# Patient Record
Sex: Male | Born: 1975
Health system: Southern US, Community
[De-identification: ages and names within clinical notes are randomized; demographics above are authoritative.]

## PROBLEM LIST (undated history)

## (undated) DIAGNOSIS — K274 Chronic or unspecified peptic ulcer, site unspecified, with hemorrhage: Secondary | ICD-10-CM

## (undated) DIAGNOSIS — K284 Chronic or unspecified gastrojejunal ulcer with hemorrhage: Secondary | ICD-10-CM

## (undated) DIAGNOSIS — G4733 Obstructive sleep apnea (adult) (pediatric): Secondary | ICD-10-CM

## (undated) DIAGNOSIS — E785 Hyperlipidemia, unspecified: Secondary | ICD-10-CM

## (undated) DIAGNOSIS — F419 Anxiety disorder, unspecified: Secondary | ICD-10-CM

## (undated) DIAGNOSIS — I10 Essential (primary) hypertension: Secondary | ICD-10-CM

## (undated) HISTORY — DX: Anxiety disorder, unspecified: F41.9

## (undated) HISTORY — DX: Obstructive sleep apnea (adult) (pediatric): G47.33

## (undated) HISTORY — DX: Hyperlipidemia, unspecified: E78.5

## (undated) HISTORY — PX: TONSILLECTOMY: SUR1361

---

## 2015-08-21 ENCOUNTER — Encounter (HOSPITAL_BASED_OUTPATIENT_CLINIC_OR_DEPARTMENT_OTHER): Payer: Self-pay | Admitting: *Deleted

## 2015-08-21 DIAGNOSIS — Z760 Encounter for issue of repeat prescription: Secondary | ICD-10-CM | POA: Insufficient documentation

## 2015-08-21 DIAGNOSIS — F17210 Nicotine dependence, cigarettes, uncomplicated: Secondary | ICD-10-CM | POA: Diagnosis not present

## 2015-08-21 DIAGNOSIS — Z8719 Personal history of other diseases of the digestive system: Secondary | ICD-10-CM | POA: Insufficient documentation

## 2015-08-21 DIAGNOSIS — I10 Essential (primary) hypertension: Secondary | ICD-10-CM | POA: Insufficient documentation

## 2015-08-21 NOTE — ED Notes (Signed)
Hypertension. States he has not been taking his BP medication for the past 6 months. Slight dizziness earlier today.

## 2015-08-22 ENCOUNTER — Emergency Department (HOSPITAL_BASED_OUTPATIENT_CLINIC_OR_DEPARTMENT_OTHER)
Admission: EM | Admit: 2015-08-22 | Discharge: 2015-08-22 | Disposition: A | Discharge status: Home / Self Care | Payer: BLUE CROSS/BLUE SHIELD | Attending: Emergency Medicine | Admitting: Emergency Medicine

## 2015-08-22 DIAGNOSIS — Z760 Encounter for issue of repeat prescription: Secondary | ICD-10-CM

## 2015-08-22 DIAGNOSIS — I10 Essential (primary) hypertension: Secondary | ICD-10-CM

## 2015-08-22 HISTORY — DX: Chronic or unspecified gastrojejunal ulcer with hemorrhage: K28.4

## 2015-08-22 HISTORY — DX: Chronic or unspecified peptic ulcer, site unspecified, with hemorrhage: K27.4

## 2015-08-22 HISTORY — DX: Essential (primary) hypertension: I10

## 2015-08-22 MED ORDER — AMLODIPINE BESYLATE 10 MG PO TABS: 10.0000 mg | ORAL_TABLET | Freq: Every day | ORAL | Status: DC

## 2015-08-22 MED ORDER — ATORVASTATIN CALCIUM 20 MG PO TABS: 20.0000 mg | ORAL_TABLET | Freq: Every day | ORAL | Status: DC

## 2015-08-22 MED ORDER — METOPROLOL SUCCINATE ER 50 MG PO TB24
50.0000 mg | ORAL_TABLET | Freq: Every day | ORAL | Status: DC
Start: 2015-08-22 — End: 2015-09-17

## 2015-08-22 NOTE — ED Provider Notes (Signed)
CSN: [id number]     Arrival date & time 08/21/15  2252 History   First MD Initiated Contact with Patient 08/22/15 [phone]     Chief Complaint  Patient presents with  . Hypertension     (Consider location/radiation/quality/duration/timing/severity/associated sxs/prior Treatment) HPI  This is a 39 year old male with a history of hypertension. He has not taken his antihypertensives in about 6 months because his physician went out of network on his insurance plan. He is here with elevated blood pressure which he noted yesterday after feeling some mild lightheadedness. It was the first time he had checked his blood pressure in 6 months. He denies chest pain, shortness of breath, headache, visual changes, nausea, vomiting or diarrhea. His blood pressure was noted to be 244/129 on arrival, rechecked it was 195/104. He is requesting refills of his antihypertensives and Lipitor. [facility] Toprol-XL 50 milligrams daily, Lipitor 20 milligrams daily and Norvasc 10 milligrams daily.  Past Medical History  Diagnosis Date  . Hypertension   . Bleeding ulcer    History reviewed. No pertinent past surgical history. No family history on file. Social History  Substance Use Topics  . Smoking status: Current Every Day Smoker -- 1.50 packs/day    Types: Cigarettes  . Smokeless tobacco: None  . Alcohol Use: No    Review of Systems  All other systems reviewed and are negative.   Allergies  Review of patient's allergies indicates no known allergies.  Home Medications   Prior to Admission medications   Not on File   BP 195/104 mmHg  Pulse 118  Temp(Src) 99.2 F (37.3 C) (Oral)  Resp 18  Ht 5\' 11"  (1.803 m)  Wt 235 lb (106.595 kg)  BMI 32.79 kg/m2  SpO2 100%   Physical Exam  General: Well-developed, well-nourished male in no acute distress; appearance consistent with age of record HENT: normocephalic; atraumatic Eyes: pupils equal, round and reactive to light; extraocular  muscles intact Neck: supple Heart: regular rate and rhythm Lungs: clear to auscultation bilaterally Abdomen: soft; nondistended; nontender; bowel sounds present Extremities: No deformity; full range of motion; pulses normal Neurologic: Awake, alert and oriented; motor function intact in all extremities and symmetric; no facial droop Skin: Warm and dry Psychiatric: Normal mood and affect    ED Course  Procedures (including critical care time)   EKG Interpretation   Date/Time:  Thursday August 21 2015 23:06:19 EST Ventricular Rate:  90 PR Interval:  136 QRS Duration: 82 QT Interval:  338 QTC Calculation: 413 R Axis:   64 Text Interpretation:  Normal sinus rhythm Left ventricular hypertrophy  Abnormal ECG No previous ECGs available Confirmed by Elira Colasanti  MD, Jonny RuizJOHN  [phone]([id number]) on 08/21/2015 11:09:15 PM      MDM  We will refill his medications and provide a list of physician referrals so he can reestablish with a PCP.    Paula LibraJohn Fedra Lanter, MD 08/22/15 [phone]

## 2015-08-22 NOTE — Discharge Instructions (Signed)
Hypertension Hypertension, commonly called high blood pressure, is when the force of blood pumping through your arteries is too strong. Your arteries are the blood vessels that carry blood from your heart throughout your body. A blood pressure reading consists of a higher number over a lower number, such as 110/72. The higher number (systolic) is the pressure inside your arteries when your heart pumps. The lower number (diastolic) is the pressure inside your arteries when your heart relaxes. Ideally you want your blood pressure below 120/80. Hypertension forces your heart to work harder to pump blood. Your arteries may become narrow or stiff. Having untreated or uncontrolled hypertension can cause heart attack, stroke, kidney disease, and other problems. RISK FACTORS Some risk factors for high blood pressure are controllable. Others are not.  Risk factors you cannot control include:   Race. You may be at higher risk if you are African American.  Age. Risk increases with age.  Gender. Men are at higher risk than women before age 45 years. After age 65, women are at higher risk than men. Risk factors you can control include:  Not getting enough exercise or physical activity.  Being overweight.  Getting too much fat, sugar, calories, or salt in your diet.  Drinking too much alcohol. SIGNS AND SYMPTOMS Hypertension does not usually cause signs or symptoms. Extremely high blood pressure (hypertensive crisis) may cause headache, anxiety, shortness of breath, and nosebleed. DIAGNOSIS To check if you have hypertension, your health care provider will measure your blood pressure while you are seated, with your arm held at the level of your heart. It should be measured at least twice using the same arm. Certain conditions can cause a difference in blood pressure between your right and left arms. A blood pressure reading that is higher than normal on one occasion does not mean that you need treatment. If  it is not clear whether you have high blood pressure, you may be asked to return on a different day to have your blood pressure checked again. Or, you may be asked to monitor your blood pressure at home for 1 or more weeks. TREATMENT Treating high blood pressure includes making lifestyle changes and possibly taking medicine. Living a healthy lifestyle can help lower high blood pressure. You may need to change some of your habits. Lifestyle changes may include:  Following the DASH diet. This diet is high in fruits, vegetables, and whole grains. It is low in salt, red meat, and added sugars.  Keep your sodium intake below 2,300 mg per day.  Getting at least 30-45 minutes of aerobic exercise at least 4 times per week.  Losing weight if necessary.  Not smoking.  Limiting alcoholic beverages.  Learning ways to reduce stress. Your health care provider may prescribe medicine if lifestyle changes are not enough to get your blood pressure under control, and if one of the following is true:  You are 18-59 years of age and your systolic blood pressure is above 140.  You are 60 years of age or older, and your systolic blood pressure is above 150.  Your diastolic blood pressure is above 90.  You have diabetes, and your systolic blood pressure is over 140 or your diastolic blood pressure is over 90.  You have kidney disease and your blood pressure is above 140/90.  You have heart disease and your blood pressure is above 140/90. Your personal target blood pressure may vary depending on your medical conditions, your age, and other factors. HOME CARE INSTRUCTIONS    Have your blood pressure rechecked as directed by your health care provider.   Take medicines only as directed by your health care provider. Follow the directions carefully. Blood pressure medicines must be taken as prescribed. The medicine does not work as well when you skip doses. Skipping doses also puts you at risk for  problems.  Do not smoke.   Monitor your blood pressure at home as directed by your health care provider. SEEK MEDICAL CARE IF:   You think you are having a reaction to medicines taken.  You have recurrent headaches or feel dizzy.  You have swelling in your ankles.  You have trouble with your vision. SEEK IMMEDIATE MEDICAL CARE IF:  You develop a severe headache or confusion.  You have unusual weakness, numbness, or feel faint.  You have severe chest or abdominal pain.  You vomit repeatedly.  You have trouble breathing. MAKE SURE YOU:   Understand these instructions.  Will watch your condition.  Will get help right away if you are not doing well or get worse.   This information is not intended to replace advice given to you by your health care provider. Make sure you discuss any questions you have with your health care provider.   Document Released: 08/09/2005 Document Revised: 12/24/2014 Document Reviewed: 06/01/2013 Elsevier Interactive Patient Education 2016 [employer]  

## 2015-09-16 ENCOUNTER — Encounter: Payer: Self-pay | Admitting: *Deleted

## 2015-09-16 ENCOUNTER — Telehealth: Payer: Self-pay | Admitting: *Deleted

## 2015-09-16 NOTE — Telephone Encounter (Signed)
Pre-Visit Call completed with patient and chart updated.   Pre-Visit Info documented in Specialty Comments under [employer].    

## 2015-09-17 ENCOUNTER — Telehealth: Payer: Self-pay | Admitting: Medical

## 2015-09-17 ENCOUNTER — Encounter: Payer: Self-pay | Admitting: Medical

## 2015-09-17 ENCOUNTER — Ambulatory Visit (INDEPENDENT_AMBULATORY_CARE_PROVIDER_SITE_OTHER): Payer: BLUE CROSS/BLUE SHIELD | Admitting: Medical

## 2015-09-17 VITALS — BP 136/88 | HR 77 | Temp 98.1°F | Ht 71.5 in | Wt 263.4 lb

## 2015-09-17 DIAGNOSIS — Z0189 Encounter for other specified special examinations: Secondary | ICD-10-CM | POA: Diagnosis not present

## 2015-09-17 DIAGNOSIS — Z23 Encounter for immunization: Secondary | ICD-10-CM | POA: Diagnosis not present

## 2015-09-17 DIAGNOSIS — Z720 Tobacco use: Secondary | ICD-10-CM

## 2015-09-17 DIAGNOSIS — F17200 Nicotine dependence, unspecified, uncomplicated: Secondary | ICD-10-CM | POA: Insufficient documentation

## 2015-09-17 DIAGNOSIS — E785 Hyperlipidemia, unspecified: Secondary | ICD-10-CM | POA: Diagnosis not present

## 2015-09-17 DIAGNOSIS — I10 Essential (primary) hypertension: Secondary | ICD-10-CM | POA: Diagnosis not present

## 2015-09-17 DIAGNOSIS — Z0000 Encounter for general adult medical examination without abnormal findings: Secondary | ICD-10-CM

## 2015-09-17 LAB — COMPREHENSIVE METABOLIC PANEL
ALBUMIN: 4.6 g/dL (ref 3.5–5.2)
ALK PHOS: 76 U/L (ref 39–117)
ALT: 29 U/L (ref 0–53)
AST: 19 U/L (ref 0–37)
BILIRUBIN TOTAL: 0.4 mg/dL (ref 0.2–1.2)
BUN: 11 mg/dL (ref 6–23)
CALCIUM: 9.5 mg/dL (ref 8.4–10.5)
CO2: 31 mEq/L (ref 19–32)
CREATININE: 1.03 mg/dL (ref 0.40–1.50)
Chloride: 104 mEq/L (ref 96–112)
GFR: 101.93 mL/min (ref >60.00)
Glucose, Bld: 106 mg/dL — ABNORMAL HIGH (ref 70–99)
Potassium: 3.7 mEq/L (ref 3.5–5.1)
Sodium: 141 mEq/L (ref 135–145)
TOTAL PROTEIN: 7.6 g/dL (ref 6.0–8.3)

## 2015-09-17 LAB — CBC WITH DIFFERENTIAL/PLATELET
BASOS ABS: 0.1 10*3/uL (ref 0.0–0.1)
BASOS PCT: 0.5 % (ref 0.0–3.0)
EOS ABS: 0.1 10*3/uL (ref 0.0–0.7)
Eosinophils Relative: 0.9 % (ref 0.0–5.0)
HEMATOCRIT: 47.9 % (ref 39.0–52.0)
HEMOGLOBIN: 15.6 g/dL (ref 13.0–17.0)
LYMPHS PCT: 26.5 % (ref 12.0–46.0)
Lymphs Abs: 2.9 10*3/uL (ref 0.7–4.0)
MCHC: 32.6 g/dL (ref 30.0–36.0)
MCV: 83.1 fl (ref 78.0–100.0)
Monocytes Absolute: 0.7 10*3/uL (ref 0.1–1.0)
Monocytes Relative: 6 % (ref 3.0–12.0)
Neutro Abs: 7.2 10*3/uL (ref 1.4–7.7)
Neutrophils Relative %: 66.1 % (ref 43.0–77.0)
Platelets: 312 10*3/uL (ref 150.0–400.0)
RBC: 5.77 Mil/uL (ref 4.22–5.81)
RDW: 14.2 % (ref 11.5–15.5)
WBC: 10.9 10*3/uL — AB (ref 4.0–10.5)

## 2015-09-17 LAB — POC URINALSYSI DIPSTICK (AUTOMATED)
Bilirubin, UA: NEGATIVE
Blood, UA: NEGATIVE
Glucose, UA: NEGATIVE
Ketones, UA: NEGATIVE
LEUKOCYTES UA: NEGATIVE
Nitrite, UA: NEGATIVE
PH UA: 6.5
PROTEIN UA: NEGATIVE
SPEC GRAV UA: 1.01
UROBILINOGEN UA: 0.2

## 2015-09-17 LAB — LIPID PANEL
CHOLESTEROL: 163 mg/dL (ref 0–200)
HDL: 40.7 mg/dL (ref >39.00)
LDL Cholesterol: 88 mg/dL (ref 0–99)
NONHDL: 122.22
Total CHOL/HDL Ratio: 4
Triglycerides: 173 mg/dL — ABNORMAL HIGH (ref 0.0–149.0)
VLDL: 34.6 mg/dL (ref 0.0–40.0)

## 2015-09-17 LAB — TSH: TSH: 2.46 u[IU]/mL (ref 0.35–4.50)

## 2015-09-17 MED ORDER — AMLODIPINE BESYLATE 10 MG PO TABS: 10.0000 mg | ORAL_TABLET | Freq: Every day | ORAL | Status: DC

## 2015-09-17 MED ORDER — ATORVASTATIN CALCIUM 20 MG PO TABS: 20.0000 mg | ORAL_TABLET | Freq: Every day | ORAL | Status: DC

## 2015-09-17 MED ORDER — METOPROLOL SUCCINATE ER 50 MG PO TB24: 50.0000 mg | ORAL_TABLET | Freq: Every day | ORAL | Status: DC

## 2015-09-17 NOTE — Telephone Encounter (Signed)
Sent in lipitor to his pharmacy.

## 2015-09-17 NOTE — Assessment & Plan Note (Signed)
Continue amlodipine and toprol.

## 2015-09-17 NOTE — Progress Notes (Signed)
Pre visit review using our clinic review tool, if applicable. No additional management support is needed unless otherwise documented below in the visit note. 

## 2015-09-17 NOTE — Patient Instructions (Addendum)
Wellness examination Cbc, cmp, tsh, lipid pane, ua, tdap vaccine today.   Will refill your bp meds today. Keep bp log/daily checks.   Try to loose some weight 5-10 pounds. Diet and walking program.   Try to cut back on smoking. Consider chantix in future.  Will check lipid panel and may make adjustment to medication if needed.  Follow up in 3-4 weeks review bp log.  Preventive Care for Adults, Male A healthy lifestyle and preventive care can promote health and wellness. Preventive health guidelines for men include the following key practices:  A routine yearly physical is a good way to check with your health care provider about your health and preventative screening. It is a chance to share any concerns and updates on your health and to receive a thorough exam.  Visit your dentist for a routine exam and preventative care every 6 months. Brush your teeth twice a day and floss once a day. Good oral hygiene prevents tooth decay and gum disease.  The frequency of eye exams is based on your age, health, family medical history, use of contact lenses, and other factors. Follow your health care provider's recommendations for frequency of eye exams.  Eat a healthy diet. Foods such as vegetables, fruits, whole grains, low-fat dairy products, and lean protein foods contain the nutrients you need without too many calories. Decrease your intake of foods high in solid fats, added sugars, and salt. Eat the right amount of calories for you.Get information about a proper diet from your health care provider, if necessary.  Regular physical exercise is one of the most important things you can do for your health. Most adults should get at least 150 minutes of moderate-intensity exercise (any activity that increases your heart rate and causes you to sweat) each week. In addition, most adults need muscle-strengthening exercises on 2 or more days a week.  Maintain a healthy weight. The body mass index (BMI) is  a screening tool to identify possible weight problems. It provides an estimate of body fat based on height and weight. Your health care provider can find your BMI and can help you achieve or maintain a healthy weight.For adults 20 years and older:  A BMI below 18.5 is considered underweight.  A BMI of 18.5 to 24.9 is normal.  A BMI of 25 to 29.9 is considered overweight.  A BMI of 30 and above is considered obese.  Maintain normal blood lipids and cholesterol levels by exercising and minimizing your intake of saturated fat. Eat a balanced diet with plenty of fruit and vegetables. Blood tests for lipids and cholesterol should begin at age 95 and be repeated every 5 years. If your lipid or cholesterol levels are high, you are over 50, or you are at high risk for heart disease, you may need your cholesterol levels checked more frequently.Ongoing high lipid and cholesterol levels should be treated with medicines if diet and exercise are not working.  If you smoke, find out from your health care provider how to quit. If you do not use tobacco, do not start.  Lung cancer screening is recommended for adults aged 42-80 years who are at high risk for developing lung cancer because of a history of smoking. A yearly low-dose CT scan of the lungs is recommended for people who have at least a 30-pack-year history of smoking and are a current smoker or have quit within the past 15 years. A pack year of smoking is smoking an average of 1 pack  of cigarettes a day for 1 year (for example: 1 pack a day for 30 years or 2 packs a day for 15 years). Yearly screening should continue until the smoker has stopped smoking for at least 15 years. Yearly screening should be stopped for people who develop a health problem that would prevent them from having lung cancer treatment.  If you choose to drink alcohol, do not have more than 2 drinks per day. One drink is considered to be 12 ounces (355 mL) of beer, 5 ounces (148 mL)  of wine, or 1.5 ounces (44 mL) of liquor.  Avoid use of street drugs. Do not share needles with anyone. Ask for help if you need support or instructions about stopping the use of drugs.  High blood pressure causes heart disease and increases the risk of stroke. Your blood pressure should be checked at least every 1-2 years. Ongoing high blood pressure should be treated with medicines, if weight loss and exercise are not effective.  If you are 32-67 years old, ask your health care provider if you should take aspirin to prevent heart disease.  Diabetes screening is done by taking a blood sample to check your blood glucose level after you have not eaten for a certain period of time (fasting). If you are not overweight and you do not have risk factors for diabetes, you should be screened once every 3 years starting at age 57. If you are overweight or obese and you are 25-64 years of age, you should be screened for diabetes every year as part of your cardiovascular risk assessment.  Colorectal cancer can be detected and often prevented. Most routine colorectal cancer screening begins at the age of 25 and continues through age 40. However, your health care provider may recommend screening at an earlier age if you have risk factors for colon cancer. On a yearly basis, your health care provider may provide home test kits to check for hidden blood in the stool. Use of a small camera at the end of a tube to directly examine the colon (sigmoidoscopy or colonoscopy) can detect the earliest forms of colorectal cancer. Talk to your health care provider about this at age 45, when routine screening begins. Direct exam of the colon should be repeated every 5-10 years through age 42, unless early forms of precancerous polyps or small growths are found.  People who are at an increased risk for hepatitis B should be screened for this virus. You are considered at high risk for hepatitis B if:  You were born in a country  where hepatitis B occurs often. Talk with your health care provider about which countries are considered high risk.  Your parents were born in a high-risk country and you have not received a shot to protect against hepatitis B (hepatitis B vaccine).  You have HIV or AIDS.  You use needles to inject street drugs.  You live with, or have sex with, someone who has hepatitis B.  You are a man who has sex with other men (MSM).  You get hemodialysis treatment.  You take certain medicines for conditions such as cancer, organ transplantation, and autoimmune conditions.  Hepatitis C blood testing is recommended for all people born from [birth year] through 1965 and any individual with known risks for hepatitis C.  Practice safe sex. Use condoms and avoid high-risk sexual practices to reduce the spread of sexually transmitted infections (STIs). STIs include gonorrhea, chlamydia, syphilis, trichomonas, herpes, HPV, and human immunodeficiency virus (HIV). Herpes,  HIV, and HPV are viral illnesses that have no cure. They can result in disability, cancer, and death.  If you are a man who has sex with other men, you should be screened at least once per year for:  HIV.  Urethral, rectal, and pharyngeal infection of gonorrhea, chlamydia, or both.  If you are at risk of being infected with HIV, it is recommended that you take a prescription medicine daily to prevent HIV infection. This is called preexposure prophylaxis (PrEP). You are considered at risk if:  You are a man who has sex with other men (MSM) and have other risk factors.  You are a heterosexual man, are sexually active, and are at increased risk for HIV infection.  You take drugs by injection.  You are sexually active with a partner who has HIV.  Talk with your health care provider about whether you are at high risk of being infected with HIV. If you choose to begin PrEP, you should first be tested for HIV. You should then be tested every 3  months for as long as you are taking PrEP.  A one-time screening for abdominal aortic aneurysm (AAA) and surgical repair of large AAAs by ultrasound are recommended for men ages 75 to 52 years who are current or former smokers.  Healthy men should no longer receive prostate-specific antigen (PSA) blood tests as part of routine cancer screening. Talk with your health care provider about prostate cancer screening.  Testicular cancer screening is not recommended for adult males who have no symptoms. Screening includes self-exam, a health care provider exam, and other screening tests. Consult with your health care provider about any symptoms you have or any concerns you have about testicular cancer.  Use sunscreen. Apply sunscreen liberally and repeatedly throughout the day. You should seek shade when your shadow is shorter than you. Protect yourself by wearing long sleeves, pants, a wide-brimmed hat, and sunglasses year round, whenever you are outdoors.  Once a month, do a whole-body skin exam, using a mirror to look at the skin on your back. Tell your health care provider about new moles, moles that have irregular borders, moles that are larger than a pencil eraser, or moles that have changed in shape or color.  Stay current with required vaccines (immunizations).  Influenza vaccine. All adults should be immunized every year.  Tetanus, diphtheria, and acellular pertussis (Td, Tdap) vaccine. An adult who has not previously received Tdap or who does not know his vaccine status should receive 1 dose of Tdap. This initial dose should be followed by tetanus and diphtheria toxoids (Td) booster doses every 10 years. Adults with an unknown or incomplete history of completing a 3-dose immunization series with Td-containing vaccines should begin or complete a primary immunization series including a Tdap dose. Adults should receive a Td booster every 10 years.  Varicella vaccine. An adult without evidence of  immunity to varicella should receive 2 doses or a second dose if he has previously received 1 dose.  Human papillomavirus (HPV) vaccine. Males aged 11-21 years who have not received the vaccine previously should receive the 3-dose series. Males aged 22-26 years may be immunized. Immunization is recommended through the age of 59 years for any male who has sex with males and did not get any or all doses earlier. Immunization is recommended for any person with an immunocompromised condition through the age of 26 years if he did not get any or all doses earlier. During the 3-dose series, the second  dose should be obtained 4-8 weeks after the first dose. The third dose should be obtained 24 weeks after the first dose and 16 weeks after the second dose.  Zoster vaccine. One dose is recommended for adults aged 83 years or older unless certain conditions are present.  Measles, mumps, and rubella (MMR) vaccine. Adults born before [birth year] generally are considered immune to measles and mumps. Adults born in [birth year] or later should have 1 or more doses of MMR vaccine unless there is a contraindication to the vaccine or there is laboratory evidence of immunity to each of the three diseases. A routine second dose of MMR vaccine should be obtained at least 28 days after the first dose for students attending postsecondary schools, health care workers, or international travelers. People who received inactivated measles vaccine or an unknown type of measles vaccine during 1963-1967 should receive 2 doses of MMR vaccine. People who received inactivated mumps vaccine or an unknown type of mumps vaccine before 1979 and are at high risk for mumps infection should consider immunization with 2 doses of MMR vaccine. Unvaccinated health care workers born before 2 who lack laboratory evidence of measles, mumps, or rubella immunity or laboratory confirmation of disease should consider measles and mumps immunization with 2 doses of MMR  vaccine or rubella immunization with 1 dose of MMR vaccine.  Pneumococcal 13-valent conjugate (PCV13) vaccine. When indicated, a person who is uncertain of his immunization history and has no record of immunization should receive the PCV13 vaccine. All adults 31 years of age and older should receive this vaccine. An adult aged 53 years or older who has certain medical conditions and has not been previously immunized should receive 1 dose of PCV13 vaccine. This PCV13 should be followed with a dose of pneumococcal polysaccharide (PPSV23) vaccine. Adults who are at high risk for pneumococcal disease should obtain the PPSV23 vaccine at least 8 weeks after the dose of PCV13 vaccine. Adults older than 42 years of age who have normal immune system function should obtain the PPSV23 vaccine dose at least 1 year after the dose of PCV13 vaccine.  Pneumococcal polysaccharide (PPSV23) vaccine. When PCV13 is also indicated, PCV13 should be obtained first. All adults aged 51 years and older should be immunized. An adult younger than age 11 years who has certain medical conditions should be immunized. Any person who resides in a nursing home or long-term care facility should be immunized. An adult smoker should be immunized. People with an immunocompromised condition and certain other conditions should receive both PCV13 and PPSV23 vaccines. People with human immunodeficiency virus (HIV) infection should be immunized as soon as possible after diagnosis. Immunization during chemotherapy or radiation therapy should be avoided. Routine use of PPSV23 vaccine is not recommended for American Indians, [place] Natives, or people younger than 65 years unless there are medical conditions that require PPSV23 vaccine. When indicated, people who have unknown immunization and have no record of immunization should receive PPSV23 vaccine. One-time revaccination 5 years after the first dose of PPSV23 is recommended for people aged 19-64 years  who have chronic kidney failure, nephrotic syndrome, asplenia, or immunocompromised conditions. People who received 1-2 doses of PPSV23 before age 98 years should receive another dose of PPSV23 vaccine at age 22 years or later if at least 5 years have passed since the previous dose. Doses of PPSV23 are not needed for people immunized with PPSV23 at or after age 3 years.  Meningococcal vaccine. Adults with asplenia or persistent complement component deficiencies  should receive 2 doses of quadrivalent meningococcal conjugate (MenACWY-D) vaccine. The doses should be obtained at least 2 months apart. Microbiologists working with certain meningococcal bacteria, [employer] recruits, people at risk during an outbreak, and people who travel to or live in countries with a high rate of meningitis should be immunized. A first-year college student up through age 78 years who is living in a residence hall should receive a dose if he did not receive a dose on or after his 16th birthday. Adults who have certain high-risk conditions should receive one or more doses of vaccine.  Hepatitis A vaccine. Adults who wish to be protected from this disease, have chronic liver disease, work with hepatitis A-infected animals, work in hepatitis A research labs, or travel to or work in countries with a high rate of hepatitis A should be immunized. Adults who were previously unvaccinated and who anticipate close contact with an international adoptee during the first 60 days after arrival in the [place] from a country with a high rate of hepatitis A should be immunized.  Hepatitis B vaccine. Adults should be immunized if they wish to be protected from this disease, are under age 29 years and have diabetes, have chronic liver disease, have had more than one sex partner in the past 6 months, may be exposed to blood or other infectious body fluids, are household contacts or sex partners of hepatitis B positive people, are clients or  workers in certain care facilities, or travel to or work in countries with a high rate of hepatitis B.  Haemophilus influenzae type b (Hib) vaccine. A previously unvaccinated person with asplenia or sickle cell disease or having a scheduled splenectomy should receive 1 dose of Hib vaccine. Regardless of previous immunization, a recipient of a hematopoietic stem cell transplant should receive a 3-dose series 6-12 months after his successful transplant. Hib vaccine is not recommended for adults with HIV infection. Preventive Service / Frequency Ages 41 to 42  Blood pressure check.** / Every 3-5 years.  Lipid and cholesterol check.** / Every 5 years beginning at age 73.  Hepatitis C blood test.** / For any individual with known risks for hepatitis C.  Skin self-exam. / Monthly.  Influenza vaccine. / Every year.  Tetanus, diphtheria, and acellular pertussis (Tdap, Td) vaccine.** / Consult your health care provider. 1 dose of Td every 10 years.  Varicella vaccine.** / Consult your health care provider.  HPV vaccine. / 3 doses over 6 months, if 90 or younger.  Measles, mumps, rubella (MMR) vaccine.** / You need at least 1 dose of MMR if you were born in 1957 or later. You may also need a second dose.  Pneumococcal 13-valent conjugate (PCV13) vaccine.** / Consult your health care provider.  Pneumococcal polysaccharide (PPSV23) vaccine.** / 1 to 2 doses if you smoke cigarettes or if you have certain conditions.  Meningococcal vaccine.** / 1 dose if you are age 77 to 27 years and a Market researcher living in a residence hall, or have one of several medical conditions. You may also need additional booster doses.  Hepatitis A vaccine.** / Consult your health care provider.  Hepatitis B vaccine.** / Consult your health care provider.  Haemophilus influenzae type b (Hib) vaccine.** / Consult your health care provider. Ages 63 to 50  Blood pressure check.** / Every year.  Lipid and  cholesterol check.** / Every 5 years beginning at age 50.  Lung cancer screening. / Every year if you are aged 51-80 years and  have a 30-pack-year history of smoking and currently smoke or have quit within the past 15 years. Yearly screening is stopped once you have quit smoking for at least 15 years or develop a health problem that would prevent you from having lung cancer treatment.  Fecal occult blood test (FOBT) of stool. / Every year beginning at age 20 and continuing until age 27. You may not have to do this test if you get a colonoscopy every 10 years.  Flexible sigmoidoscopy** or colonoscopy.** / Every 5 years for a flexible sigmoidoscopy or every 10 years for a colonoscopy beginning at age 73 and continuing until age 82.  Hepatitis C blood test.** / For all people born from [birth year] through 1965 and any individual with known risks for hepatitis C.  Skin self-exam. / Monthly.  Influenza vaccine. / Every year.  Tetanus, diphtheria, and acellular pertussis (Tdap/Td) vaccine.** / Consult your health care provider. 1 dose of Td every 10 years.  Varicella vaccine.** / Consult your health care provider.  Zoster vaccine.** / 1 dose for adults aged 17 years or older.  Measles, mumps, rubella (MMR) vaccine.** / You need at least 1 dose of MMR if you were born in 1957 or later. You may also need a second dose.  Pneumococcal 13-valent conjugate (PCV13) vaccine.** / Consult your health care provider.  Pneumococcal polysaccharide (PPSV23) vaccine.** / 1 to 2 doses if you smoke cigarettes or if you have certain conditions.  Meningococcal vaccine.** / Consult your health care provider.  Hepatitis A vaccine.** / Consult your health care provider.  Hepatitis B vaccine.** / Consult your health care provider.  Haemophilus influenzae type b (Hib) vaccine.** / Consult your health care provider. Ages 27 and over  Blood pressure check.** / Every year.  Lipid and cholesterol check.**/ Every 5 years  beginning at age 14.  Lung cancer screening. / Every year if you are aged 57-80 years and have a 30-pack-year history of smoking and currently smoke or have quit within the past 15 years. Yearly screening is stopped once you have quit smoking for at least 15 years or develop a health problem that would prevent you from having lung cancer treatment.  Fecal occult blood test (FOBT) of stool. / Every year beginning at age 36 and continuing until age 33. You may not have to do this test if you get a colonoscopy every 10 years.  Flexible sigmoidoscopy** or colonoscopy.** / Every 5 years for a flexible sigmoidoscopy or every 10 years for a colonoscopy beginning at age 30 and continuing until age 38.  Hepatitis C blood test.** / For all people born from [birth year] through 1967 and any individual with known risks for hepatitis C.  Abdominal aortic aneurysm (AAA) screening.** / A one-time screening for ages 30 to 47 years who are current or former smokers.  Skin self-exam. / Monthly.  Influenza vaccine. / Every year.  Tetanus, diphtheria, and acellular pertussis (Tdap/Td) vaccine.** / 1 dose of Td every 10 years.  Varicella vaccine.** / Consult your health care provider.  Zoster vaccine.** / 1 dose for adults aged 35 years or older.  Pneumococcal 13-valent conjugate (PCV13) vaccine.** / 1 dose for all adults aged 1 years and older.  Pneumococcal polysaccharide (PPSV23) vaccine.** / 1 dose for all adults aged 63 years and older.  Meningococcal vaccine.** / Consult your health care provider.  Hepatitis A vaccine.** / Consult your health care provider.  Hepatitis B vaccine.** / Consult your health care provider.  Haemophilus influenzae type b (  Hib) vaccine.** / Consult your health care provider. **Family history and personal history of risk and conditions may change your health care provider's recommendations.   This information is not intended to replace advice given to you by your health care  provider. Make sure you discuss any questions you have with your health care provider.   Document Released: 10/05/2001 Document Revised: 08/30/2014 Document Reviewed: 01/04/2011 Elsevier Interactive Patient Education [employer].

## 2015-09-17 NOTE — Assessment & Plan Note (Signed)
Check lipid panel today. Adjust if needed.

## 2015-09-17 NOTE — Progress Notes (Signed)
Subjective:    Patient ID: Rodney Bruce, male    DOB: [birth year], 40 y.o.   MRN: [id number]  HPI  I have reviewed pt PMH, PSH, FH, Social History and Surgical History  Pt works in [employer], pt has not been exercising, 5-6 sodas a day/pepsi, eats out a lot, single.    Pt here to get established.  Pt has hx of htn. He went to ED. Pt bp was very high before he went to the ED downstairs. Was 244/129 and later 195/104. Pt was on amlodipine and toprol. Pt was on this before. His prior pcp dropped from his insurance. So he had not been taking meds for one year. No cardiac or neurologic signs or symptoms on review.   Pt has a cuff at home but has not been checking his bp.  Pt has some hyperlipidemia.Pt is on lipitor.   Has been on meds for one month now after ED filled.  Anxiety/fear of flying- when he flies. Otherwise ok.  Pt has tried to quit smoking. Pt tried patches nicotine and hypnotism. Pt thinks he is not motivated enough. Pt tried wellbutrin as well. Wellbutrin helped a little bit.    Review of Systems  Constitutional: Negative for fever, chills, diaphoresis, activity change and fatigue.  Respiratory: Negative for cough, chest tightness and shortness of breath.   Cardiovascular: Negative for chest pain, palpitations and leg swelling.  Gastrointestinal: Negative for nausea, vomiting and abdominal pain.  Musculoskeletal: Negative for neck pain and neck stiffness.  Neurological: Negative for dizziness, seizures, syncope, speech difficulty, weakness and headaches.  Psychiatric/Behavioral: Negative for behavioral problems, confusion and agitation. The patient is not nervous/anxious.     Past Medical History  Diagnosis Date  . Hypertension   . Bleeding ulcer   . Hyperlipidemia   . Anxiety     Social History   Social History  . Marital Status: Single    Spouse Name: N/A  . Number of Children: N/A  . Years of Education: N/A   Occupational History  . Not on file.     Social History Main Topics  . Smoking status: Current Every Day Smoker -- 1.00 packs/day for 20 years    Types: Cigarettes  . Smokeless tobacco: Not on file  . Alcohol Use: No  . Drug Use: No  . Sexual Activity: Yes   Other Topics Concern  . Not on file   Social History Narrative    Past Surgical History  Procedure Laterality Date  . Tonsillectomy  age 29    Family History  Problem Relation Age of Onset  . Hypertension Mother   . Hyperlipidemia Mother     No Known Allergies  Current Outpatient Prescriptions on File Prior to Visit  Medication Sig Dispense Refill  . amLODipine (NORVASC) 10 MG tablet Take 1 tablet (10 mg total) by mouth daily. 30 tablet 0  . atorvastatin (LIPITOR) 20 MG tablet Take 1 tablet (20 mg total) by mouth daily. 30 tablet 0  . metoprolol succinate (TOPROL-XL) 50 MG 24 hr tablet Take 1 tablet (50 mg total) by mouth daily. 30 tablet 0   No current facility-administered medications on file prior to visit.    BP 136/88 mmHg  Pulse 77  Temp(Src) 98.1 F (36.7 C) (Oral)  Ht 5' 11.5" (1.816 m)  Wt 263 lb 6.4 oz (119.477 kg)  BMI 36.23 kg/m2  SpO2 96%       Objective:   Physical Exam  General Mental Status- Alert. General Appearance-  Not in acute distress.   Skin General: Color- Normal Color. Moisture- Normal Moisture.  Neck Carotid Arteries- Normal color. Moisture- Normal Moisture. No carotid bruits. No JVD.  Chest and Lung Exam Auscultation: Breath Sounds:-Normal.  Cardiovascular Auscultation:Rythm- Regular. Murmurs & Other Heart Sounds:Auscultation of the heart reveals- No Murmurs.  Abdomen Inspection:-Inspeection Normal. Palpation/Percussion:Note:No mass. Palpation and Percussion of the abdomen reveal- Non Tender, Non Distended + BS, no rebound or guarding.    Neurologic Cranial Nerve exam:- CN III-XII intact(No nystagmus), symmetric smile. Strength:- 5/5 equal and symmetric strength both upper and lower  extremities.      Assessment & Plan:

## 2015-09-17 NOTE — Assessment & Plan Note (Signed)
If pt feels motivated and ready will try chantix. In past he questions with prior efforts if he was truly motivated.

## 2015-09-17 NOTE — Assessment & Plan Note (Signed)
Cbc, cmp, tsh, lipid pane, ua, tdap vaccine today.

## 2015-09-18 ENCOUNTER — Other Ambulatory Visit (INDEPENDENT_AMBULATORY_CARE_PROVIDER_SITE_OTHER): Payer: BLUE CROSS/BLUE SHIELD

## 2015-09-18 DIAGNOSIS — R739 Hyperglycemia, unspecified: Secondary | ICD-10-CM | POA: Diagnosis not present

## 2015-09-18 LAB — HEMOGLOBIN A1C: HEMOGLOBIN A1C: 5.9 % (ref 4.6–6.5)

## 2015-09-18 LAB — HIV ANTIBODY (ROUTINE TESTING W REFLEX): HIV: NONREACTIVE

## 2015-09-18 NOTE — Telephone Encounter (Signed)
hiv test was negative.

## 2015-10-08 ENCOUNTER — Encounter: Payer: Self-pay | Admitting: Medical

## 2015-10-08 ENCOUNTER — Ambulatory Visit (INDEPENDENT_AMBULATORY_CARE_PROVIDER_SITE_OTHER): Payer: BLUE CROSS/BLUE SHIELD | Admitting: Medical

## 2015-10-08 VITALS — BP 140/90 | HR 87 | Temp 98.1°F | Ht 72.0 in | Wt 237.6 lb

## 2015-10-08 DIAGNOSIS — E785 Hyperlipidemia, unspecified: Secondary | ICD-10-CM | POA: Diagnosis not present

## 2015-10-08 DIAGNOSIS — F17200 Nicotine dependence, unspecified, uncomplicated: Secondary | ICD-10-CM

## 2015-10-08 DIAGNOSIS — I10 Essential (primary) hypertension: Secondary | ICD-10-CM | POA: Diagnosis not present

## 2015-10-08 DIAGNOSIS — Z720 Tobacco use: Secondary | ICD-10-CM | POA: Diagnosis not present

## 2015-10-08 DIAGNOSIS — N529 Male erectile dysfunction, unspecified: Secondary | ICD-10-CM

## 2015-10-08 DIAGNOSIS — E669 Obesity, unspecified: Secondary | ICD-10-CM

## 2015-10-08 MED ORDER — SILDENAFIL CITRATE 100 MG PO TABS: 100.0000 mg | ORAL_TABLET | Freq: Every day | ORAL | Status: DC | PRN

## 2015-10-08 NOTE — Progress Notes (Signed)
Pre visit review using our clinic review tool, if applicable. No additional management support is needed unless otherwise documented below in the visit note. 

## 2015-10-08 NOTE — Progress Notes (Signed)
Subjective:    Patient ID: Rodney Bruce, male    DOB: [birth year], 40 y.o.   MRN: [id number]  HPI  Pt in today for follow up. Initially seen by ED for htn. I saw him last visit. He is on amlodipine and toprol. No adverse side effects(then states maybe effecting his libido). No cardiac or neurologic signs or symptoms.  Pt has only checked his bp one time since last visit. He thinks his bp level at home was 130/88.  Pt is a smoker. And he on last visit expressed desire to try chantix.  Pt is still smoking. He is smoking less than a pack a day. Pt never had any history of depression.  Pt does have some nicoderm patches that ED filled. But he has not used.   Pt has some hyperlipidemia. He has mild high triglycerides on exam. I refilled his lipitor.  Pt has some erectile dysfunction and he would like some viagra.   Review of Systems  Constitutional: Negative for fever, chills, diaphoresis, activity change and fatigue.  Respiratory: Negative for cough, chest tightness and shortness of breath.   Cardiovascular: Negative for chest pain, palpitations and leg swelling.  Gastrointestinal: Negative for nausea, vomiting and abdominal pain.  Musculoskeletal: Negative for neck pain and neck stiffness.  Neurological: Negative for dizziness, speech difficulty, light-headedness and headaches.  Psychiatric/Behavioral: Negative for behavioral problems, confusion and agitation. The patient is not nervous/anxious.     Past Medical History  Diagnosis Date  . Hypertension   . Bleeding ulcer   . Hyperlipidemia   . Anxiety     Social History   Social History  . Marital Status: Single    Spouse Name: N/A  . Number of Children: N/A  . Years of Education: N/A   Occupational History  . Not on file.   Social History Main Topics  . Smoking status: Current Every Day Smoker -- 1.00 packs/day for 20 years    Types: Cigarettes  . Smokeless tobacco: Not on file  . Alcohol Use: No  . Drug Use:  No  . Sexual Activity: Yes   Other Topics Concern  . Not on file   Social History Narrative    Past Surgical History  Procedure Laterality Date  . Tonsillectomy  age 38    Family History  Problem Relation Age of Onset  . Hypertension Mother   . Hyperlipidemia Mother     No Known Allergies  Current Outpatient Prescriptions on File Prior to Visit  Medication Sig Dispense Refill  . amLODipine (NORVASC) 10 MG tablet Take 1 tablet (10 mg total) by mouth daily. 90 tablet 0  . atorvastatin (LIPITOR) 20 MG tablet Take 1 tablet (20 mg total) by mouth daily. 90 tablet 0  . metoprolol succinate (TOPROL-XL) 50 MG 24 hr tablet Take 1 tablet (50 mg total) by mouth daily. 90 tablet 0   No current facility-administered medications on file prior to visit.    BP 136/96 mmHg  Pulse 87  Temp(Src) 98.1 F (36.7 C) (Oral)  Ht 6' (1.829 m)  Wt 237 lb 9.6 oz (107.775 kg)  BMI 32.22 kg/m2  SpO2 95%       Objective:   Physical Exam  General Mental Status- Alert. General Appearance- Not in acute distress.   Skin General: Color- Normal Color. Moisture- Normal Moisture.  Neck Carotid Arteries- Normal color. Moisture- Normal Moisture. No carotid bruits. No JVD.  Chest and Lung Exam Auscultation: Breath Sounds:-Normal.  Cardiovascular Auscultation:Rythm- Regular. Murmurs & Other  Heart Sounds:Auscultation of the heart reveals- No Murmurs.  Abdomen Inspection:-Inspeection Normal. Palpation/Percussion:Note:No mass. Palpation and Percussion of the abdomen reveal- Non Tender, Non Distended + BS, no rebound or guarding.   Neurologic Cranial Nerve exam:- CN III-XII intact(No nystagmus), symmetric smile. Strength:- 5/5 equal and symmetric strength both upper and lower extremities.      Assessment & Plan:  For your blood pressure, I want you to check bp daily and document reading. Then call us in 2 weeks. Knowing trend will help [place] know if we need to increase bp med dosage or if we  need to add med.  Continue lipitor for cholesterol reduction.  Continue to decrease smoking and you could use nicotine patches ED rx'd. I could write med such as chantix when you are ready.(declined today)  Rx viagra as discussed.  I think cutting back on sodas and weight loss may in improve bp and may improve libido.  Follow up in 1 month or as needed  For obesity. See quality metrics today.

## 2015-10-08 NOTE — Patient Instructions (Addendum)
For your blood pressure, I want you to check bp daily and document reading. Then call us in 2 weeks. Knowing trend will help [place] know if we need to increase bp med dosage or if we need to add med.  Continue lipitor for cholesterol reduction.  Continue to decrease smoking and you could use nicotine patches ED rx'd. I could write med such as chantix when you are ready.(declined today)  Rx viagra as discussed.  I think cutting back on sodas and weight loss may in improve bp and may improve libido.  Follow up in 1 month or as needed

## 2015-11-05 ENCOUNTER — Ambulatory Visit: Payer: BLUE CROSS/BLUE SHIELD | Admitting: Medical

## 2015-11-12 ENCOUNTER — Ambulatory Visit: Payer: BLUE CROSS/BLUE SHIELD | Admitting: Medical

## 2015-11-19 ENCOUNTER — Telehealth: Payer: Self-pay | Admitting: Medical

## 2015-11-19 ENCOUNTER — Encounter: Payer: BLUE CROSS/BLUE SHIELD | Admitting: Medical

## 2015-11-19 NOTE — Telephone Encounter (Signed)
Pt lvm at 8:26 stating that he need to cancel appt. Pt didn't provide a reason.

## 2015-11-19 NOTE — Telephone Encounter (Signed)
No charge per pcp since this is first no show.

## 2015-11-19 NOTE — Progress Notes (Signed)
This encounter was created in error - please disregard.

## 2015-12-24 ENCOUNTER — Encounter: Payer: Self-pay | Admitting: Medical

## 2015-12-24 ENCOUNTER — Ambulatory Visit (INDEPENDENT_AMBULATORY_CARE_PROVIDER_SITE_OTHER): Payer: BLUE CROSS/BLUE SHIELD | Admitting: Medical

## 2015-12-24 ENCOUNTER — Telehealth: Payer: Self-pay | Admitting: Medical

## 2015-12-24 VITALS — BP 178/98 | HR 88 | Temp 97.8°F | Ht 72.0 in | Wt 238.6 lb

## 2015-12-24 DIAGNOSIS — E785 Hyperlipidemia, unspecified: Secondary | ICD-10-CM

## 2015-12-24 DIAGNOSIS — Z720 Tobacco use: Secondary | ICD-10-CM

## 2015-12-24 DIAGNOSIS — E669 Obesity, unspecified: Secondary | ICD-10-CM | POA: Diagnosis not present

## 2015-12-24 DIAGNOSIS — I10 Essential (primary) hypertension: Secondary | ICD-10-CM

## 2015-12-24 DIAGNOSIS — F17200 Nicotine dependence, unspecified, uncomplicated: Secondary | ICD-10-CM

## 2015-12-24 LAB — LIPID PANEL
CHOLESTEROL: 166 mg/dL (ref 0–200)
HDL: 33.7 mg/dL — AB (ref >39.00)
LDL CALC: 106 mg/dL — AB (ref 0–99)
NonHDL: 132.1
TRIGLYCERIDES: 129 mg/dL (ref 0.0–149.0)
Total CHOL/HDL Ratio: 5
VLDL: 25.8 mg/dL (ref 0.0–40.0)

## 2015-12-24 LAB — COMPREHENSIVE METABOLIC PANEL
ALBUMIN: 4.7 g/dL (ref 3.5–5.2)
ALT: 31 U/L (ref 0–53)
AST: 20 U/L (ref 0–37)
Alkaline Phosphatase: 79 U/L (ref 39–117)
BUN: 12 mg/dL (ref 6–23)
CALCIUM: 9.9 mg/dL (ref 8.4–10.5)
CHLORIDE: 103 meq/L (ref 96–112)
CO2: 28 mEq/L (ref 19–32)
CREATININE: 1.05 mg/dL (ref 0.40–1.50)
GFR: 99.57 mL/min (ref >60.00)
Glucose, Bld: 111 mg/dL — ABNORMAL HIGH (ref 70–99)
POTASSIUM: 4.3 meq/L (ref 3.5–5.1)
Sodium: 140 mEq/L (ref 135–145)
Total Bilirubin: 0.5 mg/dL (ref 0.2–1.2)
Total Protein: 7.6 g/dL (ref 6.0–8.3)

## 2015-12-24 MED ORDER — METOPROLOL SUCCINATE ER 50 MG PO TB24: 50.0000 mg | ORAL_TABLET | Freq: Every day | ORAL | Status: DC

## 2015-12-24 MED ORDER — ATORVASTATIN CALCIUM 20 MG PO TABS: 20.0000 mg | ORAL_TABLET | Freq: Every day | ORAL | Status: DC

## 2015-12-24 MED ORDER — AMLODIPINE BESYLATE 10 MG PO TABS: 10.0000 mg | ORAL_TABLET | Freq: Every day | ORAL | Status: DC

## 2015-12-24 MED ORDER — HYDROCHLOROTHIAZIDE 25 MG PO TABS: 25.0000 mg | ORAL_TABLET | Freq: Every day | ORAL | Status: DC

## 2015-12-24 NOTE — Patient Instructions (Addendum)
For your htn will add hctz to you regimen.(eat potassium rich foods with diurectic)  For you elevated triglycerides will get lipid panel and cmp today.  Recommend diet and exercise for weight loss.  Stop smoking recommended.   Follow up one week or as needed.  If you have any cardiac or neurologic signs then ED evaluation. Check bp daily and document reading.  DASH Eating Plan DASH stands for "Dietary Approaches to Stop Hypertension." The DASH eating plan is a healthy eating plan that has been shown to reduce high blood pressure (hypertension). Additional health benefits may include reducing the risk of type 2 diabetes mellitus, heart disease, and stroke. The DASH eating plan may also help with weight loss. WHAT DO I NEED TO KNOW ABOUT THE DASH EATING PLAN? For the DASH eating plan, you will follow these general guidelines:  Choose foods with a percent daily value for sodium of less than 5% (as listed on the food label).  Use salt-free seasonings or herbs instead of table salt or sea salt.  Check with your health care provider or pharmacist before using salt substitutes.  Eat lower-sodium products, often labeled as "lower sodium" or "no salt added."  Eat fresh foods.  Eat more vegetables, fruits, and low-fat dairy products.  Choose whole grains. Look for the word "whole" as the first word in the ingredient list.  Choose fish and skinless chicken or [place] more often than red meat. Limit fish, poultry, and meat to 6 oz (170 g) each day.  Limit sweets, desserts, sugars, and sugary drinks.  Choose heart-healthy fats.  Limit cheese to 1 oz (28 g) per day.  Eat more home-cooked food and less restaurant, buffet, and fast food.  Limit fried foods.  Cook foods using methods other than frying.  Limit canned vegetables. If you do use them, rinse them well to decrease the sodium.  When eating at a restaurant, ask that your food be prepared with less salt, or no salt if  possible. WHAT FOODS CAN I EAT? Seek help from a dietitian for individual calorie needs. Grains Whole grain or whole wheat bread. Brown rice. Whole grain or whole wheat pasta. Quinoa, bulgur, and whole grain cereals. Low-sodium cereals. Corn or whole wheat flour tortillas. Whole grain cornbread. Whole grain crackers. Low-sodium crackers. Vegetables Fresh or frozen vegetables (raw, steamed, roasted, or grilled). Low-sodium or reduced-sodium tomato and vegetable juices. Low-sodium or reduced-sodium tomato sauce and paste. Low-sodium or reduced-sodium canned vegetables.  Fruits All fresh, canned (in natural juice), or frozen fruits. Meat and Other Protein Products Ground beef (85% or leaner), grass-fed beef, or beef trimmed of fat. Skinless chicken or [place]. Ground chicken or [place]. Pork trimmed of fat. All fish and seafood. Eggs. Dried beans, peas, or lentils. Unsalted nuts and seeds. Unsalted canned beans. Dairy Low-fat dairy products, such as skim or 1% milk, 2% or reduced-fat cheeses, low-fat ricotta or cottage cheese, or plain low-fat yogurt. Low-sodium or reduced-sodium cheeses. Fats and Oils Tub margarines without trans fats. Light or reduced-fat mayonnaise and salad dressings (reduced sodium). Avocado. Safflower, olive, or canola oils. Natural peanut or almond butter. Other Unsalted popcorn and pretzels. The items listed above may not be a complete list of recommended foods or beverages. Contact your dietitian for more options. WHAT FOODS ARE NOT RECOMMENDED? Grains White bread. White pasta. White rice. Refined cornbread. Bagels and croissants. Crackers that contain trans fat. Vegetables Creamed or fried vegetables. Vegetables in a cheese sauce. Regular canned vegetables. Regular canned tomato sauce and  paste. Regular tomato and vegetable juices. Fruits Dried fruits. Canned fruit in light or heavy syrup. Fruit juice. Meat and Other Protein Products Fatty cuts of meat. Ribs, chicken  wings, bacon, sausage, bologna, salami, chitterlings, fatback, hot dogs, bratwurst, and packaged luncheon meats. Salted nuts and seeds. Canned beans with salt. Dairy Whole or 2% milk, cream, half-and-half, and cream cheese. Whole-fat or sweetened yogurt. Full-fat cheeses or blue cheese. Nondairy creamers and whipped toppings. Processed cheese, cheese spreads, or cheese curds. Condiments Onion and garlic salt, seasoned salt, table salt, and sea salt. Canned and packaged gravies. Worcestershire sauce. Tartar sauce. Barbecue sauce. Teriyaki sauce. Soy sauce, including reduced sodium. Steak sauce. Fish sauce. Oyster sauce. Cocktail sauce. Horseradish. Ketchup and mustard. Meat flavorings and tenderizers. Bouillon cubes. Hot sauce. Tabasco sauce. Marinades. Taco seasonings. Relishes. Fats and Oils Butter, stick margarine, lard, shortening, ghee, and bacon fat. Coconut, palm kernel, or palm oils. Regular salad dressings. Other Pickles and olives. Salted popcorn and pretzels. The items listed above may not be a complete list of foods and beverages to avoid. Contact your dietitian for more information. WHERE CAN I FIND MORE INFORMATION? National Heart, Lung, and Blood Institute: CablePromo.itwww.nhlbi.nih.gov/health/health-topics/topics/dash/   This information is not intended to replace advice given to you by your health care provider. Make sure you discuss any questions you have with your health care provider.   Document Released: 07/29/2011 Document Revised: 08/30/2014 Document Reviewed: 06/13/2013 Elsevier Interactive Patient Education [employer]2016 Elsevier Inc.

## 2015-12-24 NOTE — Progress Notes (Signed)
Subjective:    Patient ID: Rodney Bruce, male    DOB: February 16, 1976, 40 y.o.   MRN: [id number]  HPI   Pt blood pressure is better today than it has been in past. When he checks his bp he feels like it is close to 140 systolic. Diastolic he thinks maybe at time 95-100. Pt was checking one time a week. Pt states his machine is old. No cardiac or neurologic signs or symptoms.  Pt is smoking about 12 cigarettes a day. Pt drinks 2 liter of coke a day.  Pt not officially exercise daily. But states he is active.  On review. Pt states 09-17-2015 weight incorrect. Must have been 236 lb.  Pt triglycerides in January were little high. Pt is fasting today.   Pt last smoked around 8:45 am today.      Review of Systems  Constitutional: Negative for fever, chills, diaphoresis, activity change and fatigue.  Respiratory: Negative for cough, chest tightness and shortness of breath.   Cardiovascular: Negative for chest pain, palpitations and leg swelling.  Gastrointestinal: Negative for nausea, vomiting and abdominal pain.  Musculoskeletal: Negative for neck pain and neck stiffness.  Neurological: Negative for dizziness, tremors, speech difficulty, weakness, light-headedness and numbness.  Psychiatric/Behavioral: Negative for behavioral problems, confusion and agitation. The patient is not nervous/anxious.     Past Medical History  Diagnosis Date  . Hypertension   . Bleeding ulcer   . Hyperlipidemia   . Anxiety      Social History   Social History  . Marital Status: Single    Spouse Name: N/A  . Number of Children: N/A  . Years of Education: N/A   Occupational History  . Not on file.   Social History Main Topics  . Smoking status: Current Every Day Smoker -- 1.00 packs/day for 20 years    Types: Cigarettes  . Smokeless tobacco: Not on file  . Alcohol Use: No  . Drug Use: No  . Sexual Activity: Yes   Other Topics Concern  . Not on file   Social History Narrative     Past Surgical History  Procedure Laterality Date  . Tonsillectomy  age 40    Family History  Problem Relation Age of Onset  . Hypertension Mother   . Hyperlipidemia Mother     No Known Allergies  Current Outpatient Prescriptions on File Prior to Visit  Medication Sig Dispense Refill  . amLODipine (NORVASC) 10 MG tablet Take 1 tablet (10 mg total) by mouth daily. 90 tablet 0  . atorvastatin (LIPITOR) 20 MG tablet Take 1 tablet (20 mg total) by mouth daily. 90 tablet 0  . metoprolol succinate (TOPROL-XL) 50 MG 24 hr tablet Take 1 tablet (50 mg total) by mouth daily. 90 tablet 0  . sildenafil (VIAGRA) 100 MG tablet Take 1 tablet (100 mg total) by mouth daily as needed for erectile dysfunction. 10 tablet 0   No current facility-administered medications on file prior to visit.    BP 178/98 mmHg  Pulse 88  Temp(Src) 97.8 F (36.6 C) (Oral)  Ht 6' (1.829 m)  Wt 238 lb 9.6 oz (108.228 kg)  BMI 32.35 kg/m2  SpO2 96%       Objective:   Physical Exam   General Mental Status- Alert. General Appearance- Not in acute distress.   Skin General: Color- Normal Color. Moisture- Normal Moisture.  Neck Carotid Arteries- Normal color. Moisture- Normal Moisture. No carotid bruits. No JVD.  Chest and Lung Exam Auscultation: Breath Sounds:-Normal.  Cardiovascular Auscultation:Rythm- Regular. Murmurs & Other Heart Sounds:Auscultation of the heart reveals- No Murmurs.  .  Neurologic Cranial Nerve exam:- CN III-XII intact(No nystagmus), symmetric smile. Drift Test:- No drift. Romberg Exam:- Negative.  Finger to Nose:- Normal/Intact Strength:- 5/5 equal and symmetric strength both upper and lower extremities.     Assessment & Plan:    For your htn will add hctz to you regimen.  For you elevated triglycerides will get lipid panel and cmp today.  Recommend diet and exercise for weight loss.  Stop smoking recommended.   Follow up one week or as needed.  If you  have any cardiac or neurologic signs then ED evaluation. Check bp daily and document reading.  Kary Sugrue, Ramon Dredge, PA-C  If you have any cardiac or neurologic signs then ED evaluation. Check bp daily and document reading.

## 2015-12-24 NOTE — Telephone Encounter (Signed)
Refilled his lipid meds for 6 months. Follow up in 3 months want to recheck bp, recheck weight, and sugar level.

## 2015-12-24 NOTE — Progress Notes (Signed)
Pre visit review using our clinic review tool, if applicable. No additional management support is needed unless otherwise documented below in the visit note. 

## 2015-12-25 NOTE — Telephone Encounter (Signed)
Pt was advised on note below and he voices understanding.

## 2015-12-31 ENCOUNTER — Encounter: Payer: Self-pay | Admitting: Medical

## 2015-12-31 ENCOUNTER — Ambulatory Visit (INDEPENDENT_AMBULATORY_CARE_PROVIDER_SITE_OTHER): Payer: BLUE CROSS/BLUE SHIELD | Admitting: Medical

## 2015-12-31 VITALS — BP 142/90 | HR 87 | Temp 98.0°F | Ht 72.0 in | Wt 231.4 lb

## 2015-12-31 DIAGNOSIS — E785 Hyperlipidemia, unspecified: Secondary | ICD-10-CM | POA: Diagnosis not present

## 2015-12-31 DIAGNOSIS — I10 Essential (primary) hypertension: Secondary | ICD-10-CM

## 2015-12-31 DIAGNOSIS — Z87891 Personal history of nicotine dependence: Secondary | ICD-10-CM

## 2015-12-31 DIAGNOSIS — Z720 Tobacco use: Secondary | ICD-10-CM

## 2015-12-31 NOTE — Progress Notes (Signed)
Subjective:    Patient ID: Rodney Bruce, male    DOB: 07/06/1976, 40 y.o.   MRN: [id number]  HPI  Pt in for follow up.   Pt bp was high on last visit. Pt has not checked bp since. He states old cuff and maybe unreliable. No cardiac or neurologic signs or symptoms.(Pt admits nervous this am about visit.)  Pt stopped smoking since past Thursday. Pt states not as bad as he thought it was going to be.  Not strong desire to restart.  Pt states he stopped drinking soda. He lost 5 lbs. Cut back on fast foods. He has some exercise. Started walking.  Past Medical History  Diagnosis Date  . Hypertension   . Bleeding ulcer   . Hyperlipidemia   . Anxiety      Social History   Social History  . Marital Status: Single    Spouse Name: N/A  . Number of Children: N/A  . Years of Education: N/A   Occupational History  . Not on file.   Social History Main Topics  . Smoking status: Current Every Day Smoker -- 1.00 packs/day for 20 years    Types: Cigarettes  . Smokeless tobacco: Not on file  . Alcohol Use: No  . Drug Use: No  . Sexual Activity: Yes   Other Topics Concern  . Not on file   Social History Narrative    Past Surgical History  Procedure Laterality Date  . Tonsillectomy  age 40    Family History  Problem Relation Age of Onset  . Hypertension Mother   . Hyperlipidemia Mother     No Known Allergies  Current Outpatient Prescriptions on File Prior to Visit  Medication Sig Dispense Refill  . amLODipine (NORVASC) 10 MG tablet Take 1 tablet (10 mg total) by mouth daily. 90 tablet 0  . atorvastatin (LIPITOR) 20 MG tablet Take 1 tablet (20 mg total) by mouth daily. 90 tablet 1  . hydrochlorothiazide (HYDRODIURIL) 25 MG tablet Take 1 tablet (25 mg total) by mouth daily. 30 tablet 3  . metoprolol succinate (TOPROL-XL) 50 MG 24 hr tablet Take 1 tablet (50 mg total) by mouth daily. 90 tablet 0  . sildenafil (VIAGRA) 100 MG tablet Take 1 tablet (100 mg total) by  mouth daily as needed for erectile dysfunction. 10 tablet 0   No current facility-administered medications on file prior to visit.    BP 142/90 mmHg  Pulse 87  Temp(Src) 98 F (36.7 C) (Oral)  Ht 6' (1.829 m)  Wt 231 lb 6.4 oz (104.962 kg)  BMI 31.38 kg/m2  SpO2 97%    Review of Systems  Constitutional: Negative for fever, chills, diaphoresis, activity change and fatigue.  Respiratory: Negative for cough, chest tightness and wheezing.   Cardiovascular: Negative for chest pain and palpitations.  Gastrointestinal: Negative for nausea, vomiting and abdominal pain.  Musculoskeletal: Negative for back pain, neck pain and neck stiffness.  Neurological: Negative for dizziness, syncope, facial asymmetry, speech difficulty, weakness and light-headedness.  Hematological: Negative for adenopathy. Does not bruise/bleed easily.  Psychiatric/Behavioral: Negative for behavioral problems, confusion and agitation. The patient is not nervous/anxious.         Objective:   Physical Exam  General Mental Status- Alert. General Appearance- Not in acute distress.   Skin General: Color- Normal Color. Moisture- Normal Moisture.  Neck Carotid Arteries- Normal color. Moisture- Normal Moisture. No carotid bruits. No JVD.  Chest and Lung Exam Auscultation: Breath Sounds:-Normal.  Cardiovascular Auscultation:Rythm- Regular. Murmurs &  Other Heart Sounds:Auscultation of the heart reveals- No Murmurs.  Abdomen Inspection:-Inspeection Normal. Palpation/Percussion:Note:No mass. Palpation and Percussion of the abdomen reveal- Non Tender, Non Distended + BS, no rebound or guarding.   Neurologic Cranial Nerve exam:- CN III-XII intact(No nystagmus), symmetric smile. Strength:- 5/5 equal and symmetric strength both upper and lower extremities.      Assessment & Plan:  Your bp is borderline by MA reading and high by myself. I recollect discrepancy last visit as well between MA reading being low  and high with myself. I gave gave you hctz as add on past visit. So I want you to get otc bp cuff and check your bp at home. Check daily when you are relaxed and call me with the results on Monday. Daily bp readings at home will help [place] determine if you have some degree of white coat htn with me.   Continue current 3 bp  meds. May make adjustment on Monday if needed  Good job on stopping smoking.  Continue healthy diet and exercise.   Follow up to be determined after call on Monday.  If any cardiac or neurologic  signs or  symptoms ED evaluation.  Manuelito Poage, Ramon Dredge, PA-C

## 2015-12-31 NOTE — Progress Notes (Signed)
Pre visit review using our clinic review tool, if applicable. No additional management support is needed unless otherwise documented below in the visit note. 

## 2015-12-31 NOTE — Patient Instructions (Addendum)
Your bp is borderline by MA reading and high by myself. I recollect discrepancy last visit as well between MA reading being low and high with myself. I gave gave you hctz as add on past visit. So I want you to get otc bp cuff and check your bp at home. Check daily when you are relaxed and call me with the results on Monday. Daily bp readings at home will help us determine if you have some degree of white coat htn with me. Continue current 3 bp  meds. May make adjustment on Monday if needed  Good job on stopping smoking.  Continue healthy diet and exercise.   Follow up to be determined after call on Monday.  If any cardiac or neurologic signs or symptoms ED evaluation.

## 2016-03-25 ENCOUNTER — Other Ambulatory Visit: Payer: Self-pay | Admitting: Medical

## 2016-03-25 ENCOUNTER — Other Ambulatory Visit: Payer: Self-pay

## 2016-03-25 MED ORDER — AMLODIPINE BESYLATE 10 MG PO TABS: 10.0000 mg | ORAL_TABLET | Freq: Every day | ORAL | 0 refills | Status: DC

## 2016-03-25 MED ORDER — HYDROCHLOROTHIAZIDE 25 MG PO TABS: 25.0000 mg | ORAL_TABLET | Freq: Every day | ORAL | 3 refills | Status: DC

## 2016-03-25 MED ORDER — METOPROLOL SUCCINATE ER 50 MG PO TB24: 50.0000 mg | ORAL_TABLET | Freq: Every day | ORAL | 0 refills | Status: DC

## 2016-03-25 MED ORDER — ATORVASTATIN CALCIUM 20 MG PO TABS: 20.0000 mg | ORAL_TABLET | Freq: Every day | ORAL | 1 refills | Status: DC

## 2016-03-25 NOTE — Telephone Encounter (Signed)
Relation to NW:GNFA Call back [phone] Pharmacy: [id number] - [place], [place] - [address] OF [address] [phone] (Phone) [phone] (Fax)     Reason for call:  Patient requesting a refill  amLODipine (NORVASC) 10 MG tablet  atorvastatin (LIPITOR) 20 MG tablet  hydrochlorothiazide (HYDRODIURIL) 25 MG tablet  metoprolol succinate (TOPROL-XL) 50 MG 24 hr tablet

## 2016-03-25 NOTE — Telephone Encounter (Signed)
Medications filled per patient request.

## 2016-07-02 ENCOUNTER — Telehealth: Payer: Self-pay | Admitting: Medical

## 2016-07-02 NOTE — Telephone Encounter (Signed)
Patient is requesting a refill of atorvastatin (LIPITOR) 20 MG tablet,  metoprolol succinate (TOPROL-XL) 50 MG 24 hr tablet,  amLODipine (NORVASC) 10 MG tablet, hydrochlorothiazide (HYDRODIURIL) 25 MG tablet Patient would like a 90 day supply. Please advise.  Pharmacy: [id number] - [place], [place] - [address]

## 2016-07-02 NOTE — Telephone Encounter (Addendum)
Pt wants refill of lipitor, toprol, norvasc, and hctz. He is approaching 6 months since last visit. Will give him 90 tab rx. But he needs to come within next 3 months. Verify bp is controlled and recheck his lipids. So next time he is in come in fasting to check labs.  At that point will be about 9 months since last labs.   Will you refill those meds and advise pt.

## 2016-07-02 NOTE — Telephone Encounter (Signed)
Please advise 

## 2016-07-05 MED ORDER — ATORVASTATIN CALCIUM 20 MG PO TABS: 20.0000 mg | ORAL_TABLET | Freq: Every day | ORAL | 0 refills | Status: DC

## 2016-07-05 MED ORDER — METOPROLOL SUCCINATE ER 50 MG PO TB24: 50.0000 mg | ORAL_TABLET | Freq: Every day | ORAL | 0 refills | Status: DC

## 2016-07-05 MED ORDER — AMLODIPINE BESYLATE 10 MG PO TABS: 10.0000 mg | ORAL_TABLET | Freq: Every day | ORAL | 0 refills | Status: DC

## 2016-07-05 MED ORDER — HYDROCHLOROTHIAZIDE 25 MG PO TABS: 25.0000 mg | ORAL_TABLET | Freq: Every day | ORAL | 0 refills | Status: DC

## 2016-07-05 NOTE — Addendum Note (Signed)
Addended by: Neldon LabellaMABE, Maclane Holloran S on: 07/05/2016 01:46 PM   Modules accepted: Orders

## 2016-07-05 NOTE — Telephone Encounter (Signed)
Rx sent to pharmacy. Pt is aware that he can pick them up this afternoon.

## 2016-07-05 NOTE — Telephone Encounter (Signed)
Patient scheduled for 08/10/2016 for 6 months follow up patient states prescription never sent to pharmacy below. Please advise

## 2016-08-07 NOTE — Progress Notes (Deleted)
Subjective:    Patient ID: Colin Inaourtland Klar, male    DOB: [birth year], 40 y.o.   MRN: [id number]  HPI  Pt blood pressure was high on last visit was borderline. He reports no cardiac or neurologic signs or symptoms. Pt had rx of both toprol, amlodipine and hctz.   Pt lipids 7 month ago looked ok. Only mild ldl elevated and mild low hdl. Pt is on atorvastatin. No side effects reported.  Pt had history of ED in past. I rx'd viagra on last visit.  Offered flu vaccine today.    Review of Systems  Constitutional: Negative for chills, fatigue and fever.  Respiratory: Negative for chest tightness, shortness of breath and wheezing.   Cardiovascular: Negative for chest pain and palpitations.  Gastrointestinal: Negative for abdominal pain, nausea and vomiting.  Genitourinary: Negative for decreased urine volume, dysuria and flank pain.  Neurological: Negative for dizziness, speech difficulty, weakness and headaches.  Hematological: Negative for adenopathy. Does not bruise/bleed easily.  Psychiatric/Behavioral: Negative for agitation, confusion and suicidal ideas. The patient is not nervous/anxious.     Past Medical History:  Diagnosis Date  . Anxiety   . Bleeding ulcer   . Hyperlipidemia   . Hypertension      Social History   Social History  . Marital status: Single    Spouse name: N/A  . Number of children: N/A  . Years of education: N/A   Occupational History  . Not on file.   Social History Main Topics  . Smoking status: Former Smoker    Packs/day: 1.00    Years: 20.00    Types: Cigarettes    Quit date: 12/25/2015  . Smokeless tobacco: Not on file  . Alcohol use No  . Drug use: No  . Sexual activity: Yes   Other Topics Concern  . Not on file   Social History Narrative  . No narrative on file    Past Surgical History:  Procedure Laterality Date  . TONSILLECTOMY  age 40    Family History  Problem Relation Age of Onset  . Hypertension Mother   .  Hyperlipidemia Mother     No Known Allergies  Current Outpatient Prescriptions on File Prior to Visit  Medication Sig Dispense Refill  . amLODipine (NORVASC) 10 MG tablet Take 1 tablet (10 mg total) by mouth daily. 90 tablet 0  . atorvastatin (LIPITOR) 20 MG tablet Take 1 tablet (20 mg total) by mouth daily. 90 tablet 0  . hydrochlorothiazide (HYDRODIURIL) 25 MG tablet Take 1 tablet (25 mg total) by mouth daily. 90 tablet 0  . metoprolol succinate (TOPROL-XL) 50 MG 24 hr tablet Take 1 tablet (50 mg total) by mouth daily. 90 tablet 0  . sildenafil (VIAGRA) 100 MG tablet Take 1 tablet (100 mg total) by mouth daily as needed for erectile dysfunction. 10 tablet 0   No current facility-administered medications on file prior to visit.     There were no vitals taken for this visit.      Objective:   Physical Exam  General Mental Status- Alert. General Appearance- Not in acute distress.   Skin General: Color- Normal Color. Moisture- Normal Moisture.  Neck Carotid Arteries- Normal color. Moisture- Normal Moisture. No carotid bruits. No JVD.  Chest and Lung Exam Auscultation: Breath Sounds:-Normal.  Cardiovascular Auscultation:Rythm- Regular. Murmurs & Other Heart Sounds:Auscultation of the heart reveals- No Murmurs.  Abdomen Inspection:-Inspeection Normal. Palpation/Percussion:Note:No mass. Palpation and Percussion of the abdomen reveal- Non Tender, Non Distended + BS, no  rebound or guarding.  Neurologic Cranial Nerve exam:- CN III-XII intact(No nystagmus), symmetric smile. Strength:- 5/5 equal and symmetric strength both upper and lower extremities.      Assessment & Plan:

## 2016-08-10 ENCOUNTER — Ambulatory Visit: Payer: BLUE CROSS/BLUE SHIELD | Admitting: Medical

## 2016-09-16 ENCOUNTER — Ambulatory Visit: Payer: BLUE CROSS/BLUE SHIELD | Admitting: Medical

## 2016-10-12 ENCOUNTER — Telehealth: Payer: Self-pay | Admitting: Medical

## 2016-10-12 ENCOUNTER — Encounter: Payer: Self-pay | Admitting: Medical

## 2016-10-12 ENCOUNTER — Ambulatory Visit (INDEPENDENT_AMBULATORY_CARE_PROVIDER_SITE_OTHER): Payer: BLUE CROSS/BLUE SHIELD | Admitting: Medical

## 2016-10-12 VITALS — BP 150/80 | HR 92 | Temp 98.0°F | Ht 72.0 in | Wt 231.4 lb

## 2016-10-12 DIAGNOSIS — F17200 Nicotine dependence, unspecified, uncomplicated: Secondary | ICD-10-CM | POA: Diagnosis not present

## 2016-10-12 DIAGNOSIS — I10 Essential (primary) hypertension: Secondary | ICD-10-CM

## 2016-10-12 DIAGNOSIS — E785 Hyperlipidemia, unspecified: Secondary | ICD-10-CM

## 2016-10-12 DIAGNOSIS — R739 Hyperglycemia, unspecified: Secondary | ICD-10-CM | POA: Diagnosis not present

## 2016-10-12 LAB — LIPID PANEL
CHOLESTEROL: 140 mg/dL (ref 0–200)
HDL: 30.4 mg/dL — ABNORMAL LOW (ref >39.00)
LDL Cholesterol: 82 mg/dL (ref 0–99)
NonHDL: 109.71
TRIGLYCERIDES: 138 mg/dL (ref 0.0–149.0)
Total CHOL/HDL Ratio: 5
VLDL: 27.6 mg/dL (ref 0.0–40.0)

## 2016-10-12 LAB — COMPREHENSIVE METABOLIC PANEL
ALBUMIN: 4.8 g/dL (ref 3.5–5.2)
ALK PHOS: 63 U/L (ref 39–117)
ALT: 16 U/L (ref 0–53)
AST: 16 U/L (ref 0–37)
BUN: 11 mg/dL (ref 6–23)
CALCIUM: 9.7 mg/dL (ref 8.4–10.5)
CO2: 31 mEq/L (ref 19–32)
CREATININE: 0.99 mg/dL (ref 0.40–1.50)
Chloride: 105 mEq/L (ref 96–112)
GFR: 106.15 mL/min (ref >60.00)
Glucose, Bld: 95 mg/dL (ref 70–99)
Potassium: 3.8 mEq/L (ref 3.5–5.1)
SODIUM: 138 meq/L (ref 135–145)
TOTAL PROTEIN: 7.5 g/dL (ref 6.0–8.3)
Total Bilirubin: 0.5 mg/dL (ref 0.2–1.2)

## 2016-10-12 LAB — HEMOGLOBIN A1C: Hgb A1c MFr Bld: 5.9 % (ref 4.6–6.5)

## 2016-10-12 MED ORDER — LOSARTAN POTASSIUM 100 MG PO TABS: 100.0000 mg | ORAL_TABLET | Freq: Every day | ORAL | 0 refills | Status: DC

## 2016-10-12 MED ORDER — METOPROLOL SUCCINATE ER 50 MG PO TB24: 50.0000 mg | ORAL_TABLET | Freq: Every day | ORAL | 0 refills | Status: DC

## 2016-10-12 MED ORDER — AMLODIPINE BESYLATE 10 MG PO TABS: 10.0000 mg | ORAL_TABLET | Freq: Every day | ORAL | 0 refills | Status: DC

## 2016-10-12 MED ORDER — ATORVASTATIN CALCIUM 20 MG PO TABS: 20.0000 mg | ORAL_TABLET | Freq: Every day | ORAL | 1 refills | Status: DC

## 2016-10-12 MED ORDER — HYDROCHLOROTHIAZIDE 25 MG PO TABS: 25.0000 mg | ORAL_TABLET | Freq: Every day | ORAL | 0 refills | Status: DC

## 2016-10-12 NOTE — Progress Notes (Signed)
Subjective:    Patient ID: Rodney Bruce, male    DOB: 1975-12-18, 41 y.o.   MRN: [id number]  HPI  Pt in for follow up.   Pt is a Scientist, product/process development rep for a company. Pt blood pressure little high initially today on review. No cardiac or neurologic signs or symptoms.  Pt states he is trying to reduce his salt intake. Pt is reducing his caffeine intake. Only drinking one-two a week. Before was drinking 3 liter a day.  Pt did stop smoking for about 4 months then he started back again.   Pt had some mile elevated lipids 1 yr ago.   Also some mild elevated sugar in the past as well.  Pt declined flu vaccine   Review of Systems  Constitutional: Negative for chills, fatigue and fever.  Respiratory: Negative for cough, chest tightness, shortness of breath and wheezing.   Cardiovascular: Negative for chest pain and palpitations.  Gastrointestinal: Negative for abdominal pain.  Skin: Negative for rash.  Neurological: Negative for dizziness, tremors, syncope, weakness, numbness and headaches.  Hematological: Negative for adenopathy. Does not bruise/bleed easily.  Psychiatric/Behavioral: Negative for behavioral problems, confusion, dysphoric mood and hallucinations.    Past Medical History:  Diagnosis Date  . Anxiety   . Bleeding ulcer   . Hyperlipidemia   . Hypertension      Social History   Social History  . Marital status: Single    Spouse name: N/A  . Number of children: N/A  . Years of education: N/A   Occupational History  . Not on file.   Social History Main Topics  . Smoking status: Current Every Day Smoker    Packs/day: 1.00    Years: 20.00    Types: Cigarettes    Last attempt to quit: 12/25/2015  . Smokeless tobacco: Never Used  . Alcohol use No  . Drug use: No  . Sexual activity: Yes   Other Topics Concern  . Not on file   Social History Narrative  . No narrative on file    Past Surgical History:  Procedure Laterality Date  . TONSILLECTOMY  age 12      Family History  Problem Relation Age of Onset  . Hypertension Mother   . Hyperlipidemia Mother     No Known Allergies  Current Outpatient Prescriptions on File Prior to Visit  Medication Sig Dispense Refill  . amLODipine (NORVASC) 10 MG tablet Take 1 tablet (10 mg total) by mouth daily. 90 tablet 0  . atorvastatin (LIPITOR) 20 MG tablet Take 1 tablet (20 mg total) by mouth daily. 90 tablet 0  . hydrochlorothiazide (HYDRODIURIL) 25 MG tablet Take 1 tablet (25 mg total) by mouth daily. 90 tablet 0  . metoprolol succinate (TOPROL-XL) 50 MG 24 hr tablet Take 1 tablet (50 mg total) by mouth daily. 90 tablet 0  . sildenafil (VIAGRA) 100 MG tablet Take 1 tablet (100 mg total) by mouth daily as needed for erectile dysfunction. 10 tablet 0   No current facility-administered medications on file prior to visit.     BP (!) 152/73   Pulse 92   Temp 98 F (36.7 C) (Oral)   Ht 6' (1.829 m)   Wt 231 lb 6.4 oz (105 kg)   SpO2 96%   BMI 31.38 kg/m       Objective:   Physical Exam  General Mental Status- Alert. General Appearance- Not in acute distress.   Skin General: Color- Normal Color. Moisture- Normal Moisture.  Neck Carotid  Arteries- Normal color. Moisture- Normal Moisture. No carotid bruits. No JVD.  Chest and Lung Exam Auscultation: Breath Sounds:-Normal.  Cardiovascular Auscultation:Rythm- Regular. Murmurs & Other Heart Sounds:Auscultation of the heart reveals- No Murmurs.  Abdomen Inspection:-Inspeection Normal. Palpation/Percussion:Note:No mass. Palpation and Percussion of the abdomen reveal- Non Tender, Non Distended + BS, no rebound or guarding.  Neurologic Cranial Nerve exam:- CN III-XII intact(No nystagmus), symmetric smile. Strength:- 5/5 equal and symmetric strength both upper and lower extremities.      Assessment & Plan:  For htn we need to confirm that elevated bp today does not represent white coat htn as think may be the case. Check bp daily with  your cuff 2-3 times a day. If your readings area over 140/90 then will need to add losartan. I went ahead and made available. Sent med to the pharmacy. Please my chart me in 3-5 days with your bp readings.  For high cholesterol history will get lipids today and refill your medication accordingly.  Will get cmp and check a1-c. Today.  Please stop smoking.  Follow up date 3 months or as needed(Follow up possibly later date in labs very good and at home readings very good)  Reznor Ferrando, Ramon DredgeEdward, VF CorporationPA-C

## 2016-10-12 NOTE — Patient Instructions (Signed)
For htn we need to confirm that elevated bp today does not represent white coat htn as think may be the case. Check bp daily with your cuff 2-3 times a day. If your readings area over 140/90 then will need to add losartan. I went ahead and made available. Sent med to the pharmacy. Please my chart me in 3-5 days with your bp readings.  For high cholesterol history will get lipids today and refill your medication accordingly.  Will get cmp and check a1-c. Today.  Please stop smoking.  Follow up date 3 months or as needed(Follow up possibly later date in labs very good and at home readings very good)

## 2016-10-12 NOTE — Progress Notes (Signed)
Pre visit review using our clinic review tool, if applicable. No additional management support is needed unless otherwise documented below in the visit note. 

## 2016-10-12 NOTE — Telephone Encounter (Signed)
rx lipitor sent to her pharmacy.

## 2017-01-10 ENCOUNTER — Ambulatory Visit: Payer: BLUE CROSS/BLUE SHIELD | Admitting: Medical

## 2017-01-21 ENCOUNTER — Encounter: Payer: Self-pay | Admitting: Medical

## 2017-01-21 ENCOUNTER — Other Ambulatory Visit: Payer: Self-pay | Admitting: Medical

## 2017-01-21 ENCOUNTER — Ambulatory Visit (INDEPENDENT_AMBULATORY_CARE_PROVIDER_SITE_OTHER): Payer: BLUE CROSS/BLUE SHIELD | Admitting: Medical

## 2017-01-21 ENCOUNTER — Telehealth: Payer: Self-pay | Admitting: Medical

## 2017-01-21 VITALS — BP 165/95 | HR 87 | Temp 89.0°F | Resp 16 | Ht 74.0 in | Wt 232.8 lb

## 2017-01-21 DIAGNOSIS — R739 Hyperglycemia, unspecified: Secondary | ICD-10-CM | POA: Diagnosis not present

## 2017-01-21 DIAGNOSIS — I10 Essential (primary) hypertension: Secondary | ICD-10-CM | POA: Diagnosis not present

## 2017-01-21 DIAGNOSIS — E785 Hyperlipidemia, unspecified: Secondary | ICD-10-CM | POA: Diagnosis not present

## 2017-01-21 DIAGNOSIS — F17200 Nicotine dependence, unspecified, uncomplicated: Secondary | ICD-10-CM

## 2017-01-21 LAB — COMPREHENSIVE METABOLIC PANEL
ALBUMIN: 4.6 g/dL (ref 3.5–5.2)
ALK PHOS: 71 U/L (ref 39–117)
ALT: 21 U/L (ref 0–53)
AST: 16 U/L (ref 0–37)
BILIRUBIN TOTAL: 0.5 mg/dL (ref 0.2–1.2)
BUN: 14 mg/dL (ref 6–23)
CALCIUM: 9.9 mg/dL (ref 8.4–10.5)
CO2: 28 mEq/L (ref 19–32)
CREATININE: 0.98 mg/dL (ref 0.40–1.50)
Chloride: 105 mEq/L (ref 96–112)
GFR: 107.26 mL/min (ref >60.00)
Glucose, Bld: 105 mg/dL — ABNORMAL HIGH (ref 70–99)
Potassium: 3.7 mEq/L (ref 3.5–5.1)
Sodium: 139 mEq/L (ref 135–145)
TOTAL PROTEIN: 7.5 g/dL (ref 6.0–8.3)

## 2017-01-21 LAB — CBC WITH DIFFERENTIAL/PLATELET
BASOS ABS: 0.1 10*3/uL (ref 0.0–0.1)
Basophils Relative: 0.8 % (ref 0.0–3.0)
EOS ABS: 0 10*3/uL (ref 0.0–0.7)
Eosinophils Relative: 0.4 % (ref 0.0–5.0)
HEMATOCRIT: 47.2 % (ref 39.0–52.0)
HEMOGLOBIN: 15.4 g/dL (ref 13.0–17.0)
Lymphocytes Relative: 26 % (ref 12.0–46.0)
Lymphs Abs: 3 10*3/uL (ref 0.7–4.0)
MCHC: 32.6 g/dL (ref 30.0–36.0)
MCV: 83.9 fl (ref 78.0–100.0)
MONOS PCT: 7.2 % (ref 3.0–12.0)
Monocytes Absolute: 0.8 10*3/uL (ref 0.1–1.0)
NEUTROS ABS: 7.6 10*3/uL (ref 1.4–7.7)
Neutrophils Relative %: 65.6 % (ref 43.0–77.0)
PLATELETS: 304 10*3/uL (ref 150.0–400.0)
RBC: 5.63 Mil/uL (ref 4.22–5.81)
RDW: 14.3 % (ref 11.5–15.5)
WBC: 11.6 10*3/uL — AB (ref 4.0–10.5)

## 2017-01-21 LAB — LIPID PANEL
CHOLESTEROL: 180 mg/dL (ref 0–200)
HDL: 36.5 mg/dL — ABNORMAL LOW (ref >39.00)
LDL CALC: 109 mg/dL — AB (ref 0–99)
NonHDL: 143.78
TRIGLYCERIDES: 173 mg/dL — AB (ref 0.0–149.0)
Total CHOL/HDL Ratio: 5
VLDL: 34.6 mg/dL (ref 0.0–40.0)

## 2017-01-21 LAB — HEMOGLOBIN A1C: HEMOGLOBIN A1C: 6.1 % (ref 4.6–6.5)

## 2017-01-21 MED ORDER — CLONAZEPAM 0.5 MG PO TABS: 0.5000 mg | ORAL_TABLET | Freq: Two times a day (BID) | ORAL | 0 refills | Status: DC | PRN

## 2017-01-21 NOTE — Progress Notes (Signed)
Subjective:    Patient ID: Rodney Bruce, male    DOB: 1975-10-12, 41 y.o.   MRN: [id number]  HPI  Pt in for follow up on his bp.  Pt when he checks his bp at home they are 145/90. Pt bp reading high today when MA took. Reading was 177/101. Pt did not take losartan. Today was the first tablet. He states he gets nervous when bp is checked.  Pt is still smoking but now less than 1/2 pack a day. Down from a pack. Still drinking a liter of soda a day.   Pt is fasting. He has low hdl history.  Pt sugars have been elevated in the past.   Review of Systems  Constitutional: Negative for chills, fatigue and fever.  Respiratory: Negative for cough, chest tightness and shortness of breath.   Cardiovascular: Negative for chest pain and palpitations.  Gastrointestinal: Negative for abdominal pain, nausea and vomiting.  Musculoskeletal: Negative for neck pain.  Skin: Negative for rash.  Neurological: Negative for dizziness, speech difficulty, weakness, numbness and headaches.  Hematological: Negative for adenopathy. Does not bruise/bleed easily.  Psychiatric/Behavioral: Negative for behavioral problems, self-injury and suicidal ideas. The patient is nervous/anxious.        When checks bp and when he flies.   Past Medical History:  Diagnosis Date  . Anxiety   . Bleeding ulcer   . Hyperlipidemia   . Hypertension      Social History   Social History  . Marital status: Single    Spouse name: N/A  . Number of children: N/A  . Years of education: N/A   Occupational History  . Not on file.   Social History Main Topics  . Smoking status: Current Every Day Smoker    Packs/day: 1.00    Years: 20.00    Types: Cigarettes    Last attempt to quit: 12/25/2015  . Smokeless tobacco: Never Used  . Alcohol use No  . Drug use: No  . Sexual activity: Yes   Other Topics Concern  . Not on file   Social History Narrative  . No narrative on file    Past Surgical History:  Procedure  Laterality Date  . TONSILLECTOMY  age 41    Family History  Problem Relation Age of Onset  . Hypertension Mother   . Hyperlipidemia Mother     No Known Allergies  Current Outpatient Prescriptions on File Prior to Visit  Medication Sig Dispense Refill  . amLODipine (NORVASC) 10 MG tablet Take 1 tablet (10 mg total) by mouth daily. 90 tablet 0  . atorvastatin (LIPITOR) 20 MG tablet Take 1 tablet (20 mg total) by mouth daily. 90 tablet 1  . hydrochlorothiazide (HYDRODIURIL) 25 MG tablet Take 1 tablet (25 mg total) by mouth daily. 90 tablet 0  . losartan (COZAAR) 100 MG tablet Take 1 tablet (100 mg total) by mouth daily. 30 tablet 0  . metoprolol succinate (TOPROL-XL) 50 MG 24 hr tablet Take 1 tablet (50 mg total) by mouth daily. 90 tablet 0  . sildenafil (VIAGRA) 100 MG tablet Take 1 tablet (100 mg total) by mouth daily as needed for erectile dysfunction. 10 tablet 0   No current facility-administered medications on file prior to visit.     BP (!) 165/95   Pulse 87   Temp (!) 89 F (31.7 C) (Oral)   Resp 16   Ht 6\' 2"  (1.88 m)   Wt 232 lb 12.8 oz (105.6 kg)   SpO2 100%  BMI 29.89 kg/m       Objective:   Physical Exam   General Mental Status- Alert. General Appearance- Not in acute distress.   Skin General: Color- Normal Color. Moisture- Normal Moisture.  Neck Carotid Arteries- Normal color. Moisture- Normal Moisture. No carotid bruits. No JVD.  Chest and Lung Exam Auscultation: Breath Sounds:-Normal.  Cardiovascular Auscultation:Rythm- Regular. Murmurs & Other Heart Sounds:Auscultation of the heart reveals- No Murmurs.  Abdomen Inspection:-Inspeection Normal. Palpation/Percussion:Note:No mass. Palpation and Percussion of the abdomen reveal- Non Tender, Non Distended + BS, no rebound or guarding.    Neurologic Cranial Nerve exam:- CN III-XII intact(No nystagmus), symmetric smile. Drift Test:- No drift. Romberg Exam:- Negative.  Heal to Toe Gait  exam:-Normal. Finger to Nose:- Normal/Intact Strength:- 5/5 equal and symmetric strength both upper and lower extremities.     Assessment & Plan:  I do want you to stay on the losartan. Bp level is too high and just one tablet today not adequate time  to see full effect. I want you to schedule nurse bp check in 2 weeks to confirm bp better. Stay on diuretic and metoprolol.  Will check you lipid panel today along with cmp and sugar average test.  Continue to cut back on smoking. For anxiety related to flying will rx  Klonopin.  Follow up date to be determined after lab review.

## 2017-01-21 NOTE — Patient Instructions (Addendum)
I do want you to stay on the losartan. Bp level is too high and just one tablet today not adequate time  to see full effect. I want you to schedule nurse bp check in 2 weeks to confirm bp better. Stay on diuretic and metoprolol.  Will check you lipid panel today along with cmp and sugar average test.  Continue to cut back on smoking. For anxiety related to flying will rx  Klonopin.  Follow up date to be determined after lab review.

## 2017-01-21 NOTE — Telephone Encounter (Signed)
Relation to BJ:YNWGpt:self Call back number:[phone]   Reason for call:  [facility] - [place], [place] - [address]

## 2017-01-22 ENCOUNTER — Encounter: Payer: Self-pay | Admitting: Medical

## 2017-01-22 MED ORDER — HYDROCHLOROTHIAZIDE 25 MG PO TABS: 25.0000 mg | ORAL_TABLET | Freq: Every day | ORAL | 1 refills | Status: DC

## 2017-01-22 NOTE — Telephone Encounter (Signed)
rx refills sent to pt pharmacy.

## 2017-01-24 NOTE — Telephone Encounter (Signed)
Medication sent to correct pharmacy  

## 2017-05-19 ENCOUNTER — Telehealth: Payer: Self-pay | Admitting: Medical

## 2017-05-19 ENCOUNTER — Encounter: Payer: Self-pay | Admitting: Medical

## 2017-05-19 ENCOUNTER — Ambulatory Visit (INDEPENDENT_AMBULATORY_CARE_PROVIDER_SITE_OTHER): Payer: BLUE CROSS/BLUE SHIELD | Admitting: Medical

## 2017-05-19 VITALS — BP 155/85 | HR 89 | Temp 97.6°F | Resp 16 | Ht 72.0 in | Wt 225.5 lb

## 2017-05-19 DIAGNOSIS — R0981 Nasal congestion: Secondary | ICD-10-CM

## 2017-05-19 DIAGNOSIS — Z87891 Personal history of nicotine dependence: Secondary | ICD-10-CM | POA: Diagnosis not present

## 2017-05-19 DIAGNOSIS — I10 Essential (primary) hypertension: Secondary | ICD-10-CM | POA: Diagnosis not present

## 2017-05-19 DIAGNOSIS — E785 Hyperlipidemia, unspecified: Secondary | ICD-10-CM

## 2017-05-19 LAB — COMPREHENSIVE METABOLIC PANEL
ALT: 16 U/L (ref 0–53)
AST: 16 U/L (ref 0–37)
Albumin: 4.7 g/dL (ref 3.5–5.2)
Alkaline Phosphatase: 56 U/L (ref 39–117)
BILIRUBIN TOTAL: 0.8 mg/dL (ref 0.2–1.2)
BUN: 14 mg/dL (ref 6–23)
CALCIUM: 9.7 mg/dL (ref 8.4–10.5)
CHLORIDE: 102 meq/L (ref 96–112)
CO2: 28 meq/L (ref 19–32)
CREATININE: 0.95 mg/dL (ref 0.40–1.50)
GFR: 111.01 mL/min (ref >60.00)
GLUCOSE: 102 mg/dL — AB (ref 70–99)
Potassium: 4.3 mEq/L (ref 3.5–5.1)
SODIUM: 136 meq/L (ref 135–145)
Total Protein: 7.5 g/dL (ref 6.0–8.3)

## 2017-05-19 LAB — LIPID PANEL
CHOL/HDL RATIO: 5
Cholesterol: 190 mg/dL (ref 0–200)
HDL: 38.7 mg/dL — ABNORMAL LOW (ref >39.00)
LDL CALC: 135 mg/dL — AB (ref 0–99)
NonHDL: 151.7
Triglycerides: 85 mg/dL (ref 0.0–149.0)
VLDL: 17 mg/dL (ref 0.0–40.0)

## 2017-05-19 MED ORDER — ATORVASTATIN CALCIUM 20 MG PO TABS: 20.0000 mg | ORAL_TABLET | Freq: Every day | ORAL | 1 refills | Status: DC

## 2017-05-19 MED ORDER — FLUTICASONE PROPIONATE 50 MCG/ACT NA SUSP: 2.0000 | Freq: Every day | NASAL | 1 refills | Status: DC

## 2017-05-19 MED ORDER — ATORVASTATIN CALCIUM 20 MG PO TABS: 20.0000 mg | ORAL_TABLET | Freq: Every day | ORAL | 0 refills | Status: DC

## 2017-05-19 MED ORDER — AZITHROMYCIN 250 MG PO TABS: ORAL_TABLET | ORAL | 0 refills | Status: DC

## 2017-05-19 MED ORDER — LOSARTAN POTASSIUM 100 MG PO TABS: ORAL_TABLET | ORAL | 1 refills | Status: DC

## 2017-05-19 MED ORDER — AMLODIPINE BESYLATE 10 MG PO TABS: ORAL_TABLET | ORAL | 1 refills | Status: DC

## 2017-05-19 MED ORDER — METOPROLOL SUCCINATE ER 50 MG PO TB24: ORAL_TABLET | ORAL | 1 refills | Status: DC

## 2017-05-19 MED ORDER — HYDROCHLOROTHIAZIDE 25 MG PO TABS: 25.0000 mg | ORAL_TABLET | Freq: Every day | ORAL | 1 refills | Status: DC

## 2017-05-19 MED ORDER — CLONIDINE HCL 0.1 MG PO TABS: 0.1000 mg | ORAL_TABLET | Freq: Two times a day (BID) | ORAL | 5 refills | Status: DC

## 2017-05-19 NOTE — Progress Notes (Signed)
Subjective:    Patient ID: Rodney Bruce, male    DOB: Nov 28, 1975, 41 y.o.   MRN: [id number]  HPI  Pt in for high blood pressure. His blood pressure is moderate high initially. No cardiac or neurologic signs or symptoms. No cardiac or neurologic signs or symptoms. Pt when checks his bp is 155-145 systolic.   Pt recently running about mile and half recently.   Pt not fasting today. He has not been on lipitor. I wrote rx for 90 tabs and one refill. Pharmacy did not fill the prescription?  States recently nasal congested since trip from [place]. Ear pressure as well. Slight yellow mucous from his nose when he blows.  He states with mild anxiety in early am. He thinks related to ear pressure and early am when feels like severe congestion.   Review of Systems  Constitutional: Negative for chills, fatigue and fever.  Respiratory: Negative for cough, chest tightness, shortness of breath and wheezing.   Cardiovascular: Negative for chest pain and palpitations.  Gastrointestinal: Negative for abdominal pain, blood in stool, diarrhea, nausea and vomiting.  Genitourinary: Negative for dysuria, enuresis, flank pain, frequency, penile pain and penile swelling.  Musculoskeletal: Negative for back pain, myalgias and neck stiffness.  Skin: Negative for rash.  Neurological: Negative for dizziness, seizures, syncope, speech difficulty, weakness and light-headedness.  Hematological: Negative for adenopathy. Does not bruise/bleed easily.  Psychiatric/Behavioral: Negative for behavioral problems, confusion, self-injury and sleep disturbance. The patient is nervous/anxious.        Some mild anxiety but related to his nasal congestion and ear pressure. Not necessarily life/work type situation.    Past Medical History:  Diagnosis Date  . Anxiety   . Bleeding ulcer   . Hyperlipidemia   . Hypertension      Social History   Social History  . Marital status: Single    Spouse name: N/A  .  Number of children: N/A  . Years of education: N/A   Occupational History  . Not on file.   Social History Main Topics  . Smoking status: Current Every Day Smoker    Packs/day: 1.00    Years: 20.00    Types: Cigarettes    Last attempt to quit: 12/25/2015  . Smokeless tobacco: Never Used  . Alcohol use No  . Drug use: No  . Sexual activity: Yes   Other Topics Concern  . Not on file   Social History Narrative  . No narrative on file    Past Surgical History:  Procedure Laterality Date  . TONSILLECTOMY  age 11    Family History  Problem Relation Age of Onset  . Hypertension Mother   . Hyperlipidemia Mother     No Known Allergies  Current Outpatient Prescriptions on File Prior to Visit  Medication Sig Dispense Refill  . amLODipine (NORVASC) 10 MG tablet TAKE 1 TABLET(10 MG) BY MOUTH DAILY 90 tablet 1  . atorvastatin (LIPITOR) 20 MG tablet Take 1 tablet (20 mg total) by mouth daily. 90 tablet 1  . clonazePAM (KLONOPIN) 0.5 MG tablet Take 1 tablet (0.5 mg total) by mouth 2 (two) times daily as needed for anxiety. 6 tablet 0  . hydrochlorothiazide (HYDRODIURIL) 25 MG tablet Take 1 tablet (25 mg total) by mouth daily. 90 tablet 1  . losartan (COZAAR) 100 MG tablet TAKE 1 TABLET(100 MG) BY MOUTH DAILY 90 tablet 1  . metoprolol succinate (TOPROL-XL) 50 MG 24 hr tablet TAKE 1 TABLET(50 MG) BY MOUTH DAILY 90 tablet  1  . sildenafil (VIAGRA) 100 MG tablet Take 1 tablet (100 mg total) by mouth daily as needed for erectile dysfunction. 10 tablet 0   No current facility-administered medications on file prior to visit.     BP (!) 163/86   Pulse 89   Temp 97.6 F (36.4 C) (Oral)   Resp 16   Ht 6' (1.829 m)   Wt 225 lb 8 oz (102.3 kg)   SpO2 100%   BMI 30.58 kg/m      Objective:   Physical Exam  General Mental Status- Alert. General Appearance- Not in acute distress.   Skin General: Color- Normal Color. Moisture- Normal Moisture.  Neck Carotid Arteries- Normal color.  Moisture- Normal Moisture. No carotid bruits. No JVD.  Chest and Lung Exam Auscultation: Breath Sounds:-Normal.  Cardiovascular Auscultation:Rythm- Regular. Murmurs & Other Heart Sounds:Auscultation of the heart reveals- No Murmurs.  Abdomen Inspection:-Inspeection Normal. Palpation/Percussion:Note:No mass. Palpation and Percussion of the abdomen reveal- Non Tender, Non Distended + BS, no rebound or guarding.  Neurologic Cranial Nerve exam:- CN III-XII intact(No nystagmus), symmetric smile. Strength:- 5/5 equal and symmetric strength both upper and lower extremities.      Assessment & Plan:  Your blood pressure is still high today. I want you to continue your current regimen but I am going to add clonidine. I do want you to establish a my chart account and in 10 days I want you to send me your daily blood pressure readings. If your blood pressures still remain high then we'll consider referral to cardiologist to get opinion.  For high cholesterol will continue current Lipitor dose. Will get labs today to see where cholesterol levels are. Will include metabolic panel as well.  Again would encourage to stop smoking.  For recent nasal congestion will prescribe Flonase. This may represent allergies. If your nasal congestion symptoms worsen indicating sinus infection then making  azithromycin antibiotic available. 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   Follow-up in 3 months or as needed.   Ferris Fielden, Ramon Dredge, PA-C

## 2017-05-19 NOTE — Patient Instructions (Addendum)
Your blood pressure is still high today. I want you to continue your current regimen but I am going to add clonidine. I do want you to establish a my chart account and in 10 days I want you to send me your daily blood pressure readings. If your blood pressures still remain high then we'll consider referral to cardiologist to get opinion.  For high cholesterol will continue current Lipitor dose. Will get labs today to see where cholesterol levels are. Will include metabolic panel as well.  Again would encourage to stop smoking.  For recent nasal congestion will prescribe Flonase. This may represent allergies. If your nasal congestion symptoms worsen indicating sinus infection then making  azithromycin antibiotic available. 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   Follow-up in 3 months or as needed.

## 2017-05-19 NOTE — Telephone Encounter (Signed)
Refilled patient's Lipitor. 

## 2017-12-16 ENCOUNTER — Telehealth: Payer: Self-pay | Admitting: Medical

## 2017-12-16 NOTE — Telephone Encounter (Signed)
Pt is due for follow up please call and schedule appointment.  

## 2017-12-21 NOTE — Telephone Encounter (Signed)
Called patient and adv that a follow up appointment is due. Patient stts he will call back this week and make a appointment.

## 2018-04-07 ENCOUNTER — Encounter (HOSPITAL_BASED_OUTPATIENT_CLINIC_OR_DEPARTMENT_OTHER): Payer: Self-pay | Admitting: Emergency Medicine

## 2018-04-07 ENCOUNTER — Other Ambulatory Visit: Payer: Self-pay

## 2018-04-07 ENCOUNTER — Emergency Department (HOSPITAL_BASED_OUTPATIENT_CLINIC_OR_DEPARTMENT_OTHER)
Admission: EM | Admit: 2018-04-07 | Discharge: 2018-04-07 | Disposition: A | Discharge status: Home / Self Care | Payer: BLUE CROSS/BLUE SHIELD | Attending: Emergency Medicine | Admitting: Emergency Medicine

## 2018-04-07 DIAGNOSIS — F419 Anxiety disorder, unspecified: Secondary | ICD-10-CM | POA: Diagnosis present

## 2018-04-07 DIAGNOSIS — F17210 Nicotine dependence, cigarettes, uncomplicated: Secondary | ICD-10-CM | POA: Insufficient documentation

## 2018-04-07 DIAGNOSIS — I10 Essential (primary) hypertension: Secondary | ICD-10-CM | POA: Diagnosis not present

## 2018-04-07 DIAGNOSIS — Z79899 Other long term (current) drug therapy: Secondary | ICD-10-CM | POA: Diagnosis not present

## 2018-04-07 DIAGNOSIS — F410 Panic disorder [episodic paroxysmal anxiety] without agoraphobia: Secondary | ICD-10-CM | POA: Diagnosis not present

## 2018-04-07 MED ORDER — LORAZEPAM 1 MG PO TABS: 1.0000 mg | ORAL_TABLET | Freq: Three times a day (TID) | ORAL | 0 refills | Status: DC | PRN

## 2018-04-07 NOTE — ED Provider Notes (Addendum)
[facility] Provider Note: [name], MD, FACEP  CSN: [id number] MRN: [id number] ARRIVAL: 04/07/18 at 0022 ROOM: [facility]01   CHIEF COMPLAINT  Panic Attack   HISTORY OF PRESENT ILLNESS  04/07/18 1:21 AM Rodney Bruce is a 42 y.o. male with a history of anxiety who quit smoking about 4 months ago.  He had been doing well until about a week ago when he started having episodes of anxiety.  He is here after having what he describes as a panic attack prior to arrival.  It was characterized as a sense of impending doom, hyperventilation and heart racing.  He denies associated chest pain or paresthesias.  His symptoms have subsequently abated.  He states that he is on blood pressure medication but he believes the anxiety is driving his blood pressure up and consequently concerned about his elevated blood pressure his increased his anxiety.  He has an appointment with his PCP in 4 days.   Past Medical History:  Diagnosis Date  . Anxiety   . Bleeding ulcer   . Hyperlipidemia   . Hypertension     Past Surgical History:  Procedure Laterality Date  . TONSILLECTOMY  age 42    Family History  Problem Relation Age of Onset  . Hypertension Mother   . Hyperlipidemia Mother     Social History   Tobacco Use  . Smoking status: Former Smoker    Packs/day: 1.00    Years: 20.00    Pack years: 20.00    Types: Cigarettes    Last attempt to quit: 11/23/2017    Years since quitting: 0.3  . Smokeless tobacco: Never Used  Substance Use Topics  . Alcohol use: No  . Drug use: No    Prior to Admission medications   Medication Sig Start Date End Date Taking? Authorizing Provider  amLODipine (NORVASC) 10 MG tablet TAKE 1 TABLET(10 MG) BY MOUTH DAILY 12/16/17   Saguier, Ramon DredgeEdward, PA-C  atorvastatin (LIPITOR) 20 MG tablet Take 1 tablet (20 mg total) by mouth daily. 05/19/17   Saguier, Ramon DredgeEdward, PA-C  atorvastatin (LIPITOR) 20 MG tablet TAKE 1 TABLET(20 MG) BY MOUTH DAILY 12/16/17    Saguier, Ramon DredgeEdward, PA-C  azithromycin (ZITHROMAX) 250 MG tablet Take 2 tablets by mouth on day 1, followed by 1 tablet by mouth daily for 4 days. 05/19/17   Saguier, Ramon DredgeEdward, PA-C  cloNIDine (CATAPRES) 0.1 MG tablet Take 1 tablet (0.1 mg total) by mouth 2 (two) times daily. 05/19/17   Saguier, Ramon DredgeEdward, PA-C  fluticasone (FLONASE) 50 MCG/ACT nasal spray Place 2 sprays into both nostrils daily. 05/19/17   Saguier, Ramon DredgeEdward, PA-C  hydrochlorothiazide (HYDRODIURIL) 25 MG tablet TAKE 1 TABLET(25 MG) BY MOUTH DAILY 12/16/17   Saguier, Ramon DredgeEdward, PA-C  losartan (COZAAR) 100 MG tablet TAKE 1 TABLET(100 MG) BY MOUTH DAILY 12/16/17   Saguier, Ramon DredgeEdward, PA-C  metoprolol succinate (TOPROL-XL) 50 MG 24 hr tablet TAKE 1 TABLET(50 MG) BY MOUTH DAILY 12/16/17   Saguier, Ramon DredgeEdward, PA-C  sildenafil (VIAGRA) 100 MG tablet Take 1 tablet (100 mg total) by mouth daily as needed for erectile dysfunction. 10/08/15   Saguier, Ramon DredgeEdward, PA-C    Allergies Patient has no known allergies.   REVIEW OF SYSTEMS  Negative except as noted here or in the History of Present Illness.   PHYSICAL EXAMINATION  Initial Vital Signs Blood pressure (!) 179/114, pulse (!) 102, temperature 98.3 F (36.8 C), temperature source Oral, resp. rate 18, height 5\' 10"  (1.778 m), weight 106.6 kg, SpO2 100 %.  Examination General: Well-developed, well-nourished male in no acute distress; appearance consistent with age of record HENT: normocephalic; atraumatic Eyes: pupils equal, round and reactive to light; extraocular muscles intact Neck: supple Heart: regular rate and rhythm Lungs: clear to auscultation bilaterally Abdomen: soft; nondistended; nontender; bowel sounds present Extremities: No deformity; full range of motion; pulses normal Neurologic: Awake, alert and oriented; motor function intact in all extremities and symmetric; no facial droop Skin: Warm and dry Psychiatric: Normal mood and affect   RESULTS  Summary of this visit's results, reviewed  by myself:   EKG Interpretation  Date/Time:    Ventricular Rate:    PR Interval:    QRS Duration:   QT Interval:    QTC Calculation:   R Axis:     Text Interpretation:        Laboratory Studies: No results found for this or any previous visit (from the past 24 hour(s)). Imaging Studies: No results found.  ED COURSE and MDM  Nursing notes and initial vitals signs, including pulse oximetry, reviewed.  Vitals:   04/07/18 0033  BP: (!) 179/114  Pulse: (!) 102  Resp: 18  Temp: 98.3 F (36.8 C)  TempSrc: Oral  SpO2: 100%  Weight: 106.6 kg  Height: 5\' 10"  (1.778 m)   We will treat with a short course of Ativan and refer back to his PCP with whom he has an appointment as noted above.  PROCEDURES    ED DIAGNOSES     ICD-10-CM   1. Panic attack F41.0   2. Hypertension not at goal I10        Arath Kaigler, Jonny RuizJohn, MD 04/07/18 0129    Paula LibraMolpus, Tijuan Dantes, MD 04/07/18 [phone]

## 2018-04-07 NOTE — ED Notes (Signed)
ED Provider at bedside. 

## 2018-04-07 NOTE — ED Triage Notes (Signed)
Pt states he is having increase anxiety for the past week and not able to sleep. Pt states she quick smoking 4 months ago and he is getting very anxious.

## 2018-04-07 NOTE — ED Notes (Signed)
Pt verbalizes understanding of d/c instructions and denies any further needs at this time. 

## 2018-04-11 ENCOUNTER — Ambulatory Visit: Payer: BLUE CROSS/BLUE SHIELD | Admitting: Medical

## 2018-04-11 ENCOUNTER — Encounter: Payer: Self-pay | Admitting: Medical

## 2018-04-11 VITALS — BP 175/90 | HR 89 | Temp 98.2°F | Resp 16 | Ht 70.0 in | Wt 233.4 lb

## 2018-04-11 DIAGNOSIS — E785 Hyperlipidemia, unspecified: Secondary | ICD-10-CM | POA: Diagnosis not present

## 2018-04-11 DIAGNOSIS — I10 Essential (primary) hypertension: Secondary | ICD-10-CM | POA: Diagnosis not present

## 2018-04-11 DIAGNOSIS — R739 Hyperglycemia, unspecified: Secondary | ICD-10-CM | POA: Diagnosis not present

## 2018-04-11 LAB — HEMOGLOBIN A1C: HEMOGLOBIN A1C: 5.8 % (ref 4.6–6.5)

## 2018-04-11 LAB — LIPID PANEL
CHOLESTEROL: 124 mg/dL (ref 0–200)
HDL: 36.1 mg/dL — AB (ref >39.00)
LDL Cholesterol: 63 mg/dL (ref 0–99)
NONHDL: 87.6
Total CHOL/HDL Ratio: 3
Triglycerides: 121 mg/dL (ref 0.0–149.0)
VLDL: 24.2 mg/dL (ref 0.0–40.0)

## 2018-04-11 LAB — COMPREHENSIVE METABOLIC PANEL
ALK PHOS: 68 U/L (ref 39–117)
ALT: 17 U/L (ref 0–53)
AST: 12 U/L (ref 0–37)
Albumin: 4.7 g/dL (ref 3.5–5.2)
BILIRUBIN TOTAL: 0.7 mg/dL (ref 0.2–1.2)
BUN: 16 mg/dL (ref 6–23)
CO2: 32 mEq/L (ref 19–32)
Calcium: 10.4 mg/dL (ref 8.4–10.5)
Chloride: 101 mEq/L (ref 96–112)
Creatinine, Ser: 1.19 mg/dL (ref 0.40–1.50)
GFR: 85.25 mL/min (ref >60.00)
GLUCOSE: 113 mg/dL — AB (ref 70–99)
Potassium: 4.5 mEq/L (ref 3.5–5.1)
SODIUM: 138 meq/L (ref 135–145)
TOTAL PROTEIN: 7.5 g/dL (ref 6.0–8.3)

## 2018-04-11 MED ORDER — CLONIDINE HCL 0.1 MG PO TABS: 0.1000 mg | ORAL_TABLET | Freq: Two times a day (BID) | ORAL | 5 refills | Status: DC

## 2018-04-11 NOTE — Progress Notes (Signed)
Subjective:    Patient ID: Rodney Bruce, male    DOB: [birth year], 42 y.o.   MRN: [id number]  HPI  Pt in for follow up.  Pt states his blood pressure has been high. He states checking his blood pressure has been causing him anxiety. Pt states he is starting to get stressed with work and some personal stress. Pt quit smoking overt 4 months ago.  Pt states no further severe craving for cigarrettes. Pt states he also stopped caffine beverages.  He was feeling panicky the other day before he went to the ED. The emergency dept gave him ativan 1 mg tid. But he states he is only taking one tablet at night.   Pt states most recent bp when he checked at home was 155/92. At ED his blood pressure was 179/114. They did not give bp medication.   He states every time he takes bp he feels very nervous.   Currently on first read bp was 170/88. Pt reports no cardiac or neurologic signs or symptoms.  Pt in past had been toprol 50 mg daily, losartan 100 mg and amlodipine 10 mg daily and hctz 25 mg a day    Review of Systems  Constitutional: Negative for chills and fatigue.  Respiratory: Negative for cough, chest tightness, shortness of breath and wheezing.   Cardiovascular: Negative for chest pain and palpitations.  Gastrointestinal: Negative for abdominal pain.  Musculoskeletal: Negative for back pain.  Skin: Negative for rash.  Neurological: Negative for dizziness, tremors, speech difficulty and light-headedness.  Hematological: Negative for adenopathy. Does not bruise/bleed easily.  Psychiatric/Behavioral: Negative for behavioral problems, confusion and sleep disturbance. The patient is nervous/anxious. The patient is not hyperactive.    Past Medical History:  Diagnosis Date  . Anxiety   . Bleeding ulcer   . Hyperlipidemia   . Hypertension      Social History   Socioeconomic History  . Marital status: Single    Spouse name: Not on file  . Number of children: Not on file  .  Years of education: Not on file  . Highest education level: Not on file  Occupational History  . Not on file  Social Needs  . Financial resource strain: Not on file  . Food insecurity:    Worry: Not on file    Inability: Not on file  . Transportation needs:    Medical: Not on file    Non-medical: Not on file  Tobacco Use  . Smoking status: Former Smoker    Packs/day: 1.00    Years: 20.00    Pack years: 20.00    Types: Cigarettes    Last attempt to quit: 11/23/2017    Years since quitting: 0.3  . Smokeless tobacco: Never Used  Substance and Sexual Activity  . Alcohol use: No  . Drug use: No  . Sexual activity: Yes  Lifestyle  . Physical activity:    Days per week: Not on file    Minutes per session: Not on file  . Stress: Not on file  Relationships  . Social connections:    Talks on phone: Not on file    Gets together: Not on file    Attends religious service: Not on file    Active member of club or organization: Not on file    Attends meetings of clubs or organizations: Not on file    Relationship status: Not on file  . Intimate partner violence:    Fear of current or ex partner: Not  on file    Emotionally abused: Not on file    Physically abused: Not on file    Forced sexual activity: Not on file  Other Topics Concern  . Not on file  Social History Narrative  . Not on file    Past Surgical History:  Procedure Laterality Date  . TONSILLECTOMY  age 42    Family History  Problem Relation Age of Onset  . Hypertension Mother   . Hyperlipidemia Mother     No Known Allergies  Current Outpatient Medications on File Prior to Visit  Medication Sig Dispense Refill  . amLODipine (NORVASC) 10 MG tablet TAKE 1 TABLET(10 MG) BY MOUTH DAILY 90 tablet 0  . atorvastatin (LIPITOR) 20 MG tablet Take 1 tablet (20 mg total) by mouth daily. 90 tablet 1  . cloNIDine (CATAPRES) 0.1 MG tablet Take 1 tablet (0.1 mg total) by mouth 2 (two) times daily. 60 tablet 5  .  hydrochlorothiazide (HYDRODIURIL) 25 MG tablet TAKE 1 TABLET(25 MG) BY MOUTH DAILY 90 tablet 0  . LORazepam (ATIVAN) 1 MG tablet Take 1 tablet (1 mg total) by mouth 3 (three) times daily as needed for anxiety. 15 tablet 0  . losartan (COZAAR) 100 MG tablet TAKE 1 TABLET(100 MG) BY MOUTH DAILY 90 tablet 0  . metoprolol succinate (TOPROL-XL) 50 MG 24 hr tablet TAKE 1 TABLET(50 MG) BY MOUTH DAILY 90 tablet 0  . sildenafil (VIAGRA) 100 MG tablet Take 1 tablet (100 mg total) by mouth daily as needed for erectile dysfunction. 10 tablet 0   No current facility-administered medications on file prior to visit.     BP (!) 175/90   Pulse 89   Temp 98.2 F (36.8 C) (Oral)   Resp 16   Ht 5\' 10"  (1.778 m)   Wt 233 lb 6.4 oz (105.9 kg)   SpO2 100%   BMI 33.49 kg/m       Objective:   Physical Exam  General Mental Status- Alert. General Appearance- Not in acute distress.   Skin General: Color- Normal Color. Moisture- Normal Moisture.  Neck Carotid Arteries- Normal color. Moisture- Normal Moisture. No carotid bruits. No JVD.  Chest and Lung Exam Auscultation: Breath Sounds:-Normal.  Cardiovascular Auscultation:Rythm- Regular. Murmurs & Other Heart Sounds:Auscultation of the heart reveals- No Murmurs.  Abdomen Inspection:-Inspeection Normal. Palpation/Percussion:Note:No mass. Palpation and Percussion of the abdomen reveal- Non Tender, Non Distended + BS, no rebound or guarding.  Neurologic Cranial Nerve exam:- CN III-XII intact(No nystagmus), symmetric smile. Strength:- 5/5 equal and symmetric strength both upper and lower extremities.      Assessment & Plan:  Your blood pressure is moderate to severe high. I am prescribing you clonidine 0.1 mg to take twice daily in addition to your other bp meds.   Clonidine can bring bp down and may help you relax some.  Please get labs today.   If with high bp and cardiac or neurologic signs/symtoms then ED evaluation  Follow up in 2  weeks or as needed

## 2018-04-11 NOTE — Patient Instructions (Signed)
Your blood pressure is moderate to severe high. I am prescribing you clonidine 0.1 mg to take twice daily in addition to your other bp meds.   Clonidine can bring bp down and may help you relax some.  Please get labs today.   If with high bp and cardiac or neurologic signs/symtoms then ED evaluation  Follow up in 2 weeks or as needed

## 2018-04-14 ENCOUNTER — Ambulatory Visit: Payer: BLUE CROSS/BLUE SHIELD | Admitting: Medical

## 2018-04-14 ENCOUNTER — Encounter: Payer: Self-pay | Admitting: Medical

## 2018-04-14 VITALS — BP 166/96 | HR 95 | Temp 98.2°F | Resp 16 | Ht 70.0 in | Wt 230.2 lb

## 2018-04-14 DIAGNOSIS — I10 Essential (primary) hypertension: Secondary | ICD-10-CM | POA: Diagnosis not present

## 2018-04-14 DIAGNOSIS — F419 Anxiety disorder, unspecified: Secondary | ICD-10-CM

## 2018-04-14 MED ORDER — BUSPIRONE HCL 15 MG PO TABS: 15.0000 mg | ORAL_TABLET | Freq: Three times a day (TID) | ORAL | 0 refills | Status: DC

## 2018-04-14 MED ORDER — SERTRALINE HCL 25 MG PO TABS: 25.0000 mg | ORAL_TABLET | Freq: Every day | ORAL | 0 refills | Status: DC

## 2018-04-14 NOTE — Patient Instructions (Addendum)
Your blood pressure is still elevated despite using low dose clonidine.  You also are reporting some persisting anxiety that might be impacting your blood pressure readings.  I want you to start BuSpar tablets 1 tablet twice daily.  Then on Monday add sertraline 25mg  tab daily.  Also want you to increase clonidine 0.1 mg tablet to 2 tablets twice daily.  You might want to put in fresh batteries on your blood pressure machine.  If you have any cardiac or neurologic signs or symptoms over the weekend then recommend ED evaluation.  Advised presently not to use Ativan but use the BuSpar in place of.  If your blood pressures are still persistently elevated then I plan on ordering ultrasound renal arteries.  Follow-up Friday 1:40 or as needed

## 2018-04-14 NOTE — Progress Notes (Signed)
Subjective:    Patient ID: Rodney Bruce, male    DOB: August 24, 1975, 42 y.o.   MRN: [id number]  HPI  Pt in states he is now at the point of having constant anxiety. Pt states he is always feeling on edge now. He is having some thoughts of getting cancer as he is fomer smoker. Describes random thoughts of his mortality. When he lies down and rest will get anxious. When active and busy feels fine. No sob on activity. But at times at night when laying down before sleeping will get anxious first followed by panicky feeling.  Pt woke up this am and his bp was 182/100. The felt anxious at that time but no cardiac or neurologic signs or symptoms. Also no respiratory symptoms at that time.  Later in the day he checked and  bp readings were 165/96, 164/95, 159/97, 161/100, 149/93, 161,99, 150/92. 151/88, 152/89, 155/93, 161/87, 157/91, and 159/93.   Pt med amlodipine 10 mg day, catapres 0.1 mg bid, hctz 25 daily, losartan 100 mg daily and toprol xl  50 mg daily.     Review of Systems  Constitutional: Negative for chills, fatigue and fever.  Respiratory: Negative for cough, chest tightness, shortness of breath and wheezing.   Cardiovascular: Negative for chest pain and palpitations.  Gastrointestinal: Negative for abdominal pain.  Musculoskeletal: Negative for back pain and joint swelling.  Skin: Negative for rash.  Neurological: Negative for dizziness and headaches.  Hematological: Negative for adenopathy. Does not bruise/bleed easily.  Psychiatric/Behavioral: Negative for behavioral problems, confusion, self-injury and suicidal ideas. The patient is nervous/anxious.     Past Medical History:  Diagnosis Date  . Anxiety   . Bleeding ulcer   . Hyperlipidemia   . Hypertension      Social History   Socioeconomic History  . Marital status: Single    Spouse name: Not on file  . Number of children: Not on file  . Years of education: Not on file  . Highest education level: Not on file   Occupational History  . Not on file  Social Needs  . Financial resource strain: Not on file  . Food insecurity:    Worry: Not on file    Inability: Not on file  . Transportation needs:    Medical: Not on file    Non-medical: Not on file  Tobacco Use  . Smoking status: Former Smoker    Packs/day: 1.00    Years: 20.00    Pack years: 20.00    Types: Cigarettes    Last attempt to quit: 11/23/2017    Years since quitting: 0.3  . Smokeless tobacco: Never Used  Substance and Sexual Activity  . Alcohol use: No  . Drug use: No  . Sexual activity: Yes  Lifestyle  . Physical activity:    Days per week: Not on file    Minutes per session: Not on file  . Stress: Not on file  Relationships  . Social connections:    Talks on phone: Not on file    Gets together: Not on file    Attends religious service: Not on file    Active member of club or organization: Not on file    Attends meetings of clubs or organizations: Not on file    Relationship status: Not on file  . Intimate partner violence:    Fear of current or ex partner: Not on file    Emotionally abused: Not on file    Physically abused: Not on  file    Forced sexual activity: Not on file  Other Topics Concern  . Not on file  Social History Narrative  . Not on file    Past Surgical History:  Procedure Laterality Date  . TONSILLECTOMY  age 84    Family History  Problem Relation Age of Onset  . Hypertension Mother   . Hyperlipidemia Mother     No Known Allergies  Current Outpatient Medications on File Prior to Visit  Medication Sig Dispense Refill  . amLODipine (NORVASC) 10 MG tablet TAKE 1 TABLET(10 MG) BY MOUTH DAILY 90 tablet 0  . atorvastatin (LIPITOR) 20 MG tablet Take 1 tablet (20 mg total) by mouth daily. 90 tablet 1  . cloNIDine (CATAPRES) 0.1 MG tablet Take 1 tablet (0.1 mg total) by mouth 2 (two) times daily. 60 tablet 5  . hydrochlorothiazide (HYDRODIURIL) 25 MG tablet TAKE 1 TABLET(25 MG) BY MOUTH DAILY  90 tablet 0  . LORazepam (ATIVAN) 1 MG tablet Take 1 tablet (1 mg total) by mouth 3 (three) times daily as needed for anxiety. 15 tablet 0  . losartan (COZAAR) 100 MG tablet TAKE 1 TABLET(100 MG) BY MOUTH DAILY 90 tablet 0  . metoprolol succinate (TOPROL-XL) 50 MG 24 hr tablet TAKE 1 TABLET(50 MG) BY MOUTH DAILY 90 tablet 0  . sildenafil (VIAGRA) 100 MG tablet Take 1 tablet (100 mg total) by mouth daily as needed for erectile dysfunction. 10 tablet 0   No current facility-administered medications on file prior to visit.     BP (!) 166/96   Pulse 95   Temp 98.2 F (36.8 C) (Oral)   Resp 16   Ht 5\' 10"  (1.778 m)   Wt 230 lb 3.2 oz (104.4 kg)   SpO2 100%   BMI 33.03 kg/m       Objective:   Physical Exam  General Mental Status- Alert. General Appearance- Not in acute distress.   Skin General: Color- Normal Color. Moisture- Normal Moisture.  Neck Carotid Arteries- Normal color. Moisture- Normal Moisture. No carotid bruits. No JVD.  Chest and Lung Exam Auscultation: Breath Sounds:-Normal.  Cardiovascular Auscultation:Rythm- Regular. Murmurs & Other Heart Sounds:Auscultation of the heart reveals- No Murmurs.  Abdomen Inspection:-Inspeection Normal. Palpation/Percussion:Note:No mass. Palpation and Percussion of the abdomen reveal- Non Tender, Non Distended + BS, no rebound or guarding.    Neurologic Cranial Nerve exam:- CN III-XII intact(No nystagmus), symmetric smile. Strength:- 5/5 equal and symmetric strength both upper and lower extremities.      Assessment & Plan:  Your blood pressure is still elevated despite using low dose clonidine.  You also are reporting some persisting anxiety that might be impacting your blood pressure readings.  I want you to start BuSpar tablets 1 tablet twice daily.  Then on Monday add sertraline 25mg  tab daily.  Also want you to increase clonidine 0.1 mg tablet to 2 tablets twice daily.  You might want to put in fresh batteries on  your blood pressure machine.  If you have any cardiac or neurologic signs or symptoms over the weekend then recommend ED evaluation.  Advised presently not to use Ativan but use the BuSpar in place of.  If your blood pressures are still persistently elevated then I plan on ordering ultrasound renal arteries.  Follow-up Friday 1:40 or as needed  [employer], PA-C

## 2018-04-17 ENCOUNTER — Other Ambulatory Visit: Payer: Self-pay

## 2018-04-17 ENCOUNTER — Telehealth: Payer: Self-pay | Admitting: Medical

## 2018-04-17 ENCOUNTER — Encounter (HOSPITAL_BASED_OUTPATIENT_CLINIC_OR_DEPARTMENT_OTHER): Payer: Self-pay | Admitting: Emergency Medicine

## 2018-04-17 ENCOUNTER — Telehealth: Payer: Self-pay

## 2018-04-17 ENCOUNTER — Emergency Department (HOSPITAL_BASED_OUTPATIENT_CLINIC_OR_DEPARTMENT_OTHER)
Admission: EM | Admit: 2018-04-17 | Discharge: 2018-04-17 | Disposition: A | Discharge status: Home / Self Care | Payer: BLUE CROSS/BLUE SHIELD | Attending: Emergency Medicine | Admitting: Emergency Medicine

## 2018-04-17 DIAGNOSIS — F419 Anxiety disorder, unspecified: Secondary | ICD-10-CM | POA: Diagnosis not present

## 2018-04-17 DIAGNOSIS — Z87891 Personal history of nicotine dependence: Secondary | ICD-10-CM | POA: Insufficient documentation

## 2018-04-17 DIAGNOSIS — I10 Essential (primary) hypertension: Secondary | ICD-10-CM | POA: Insufficient documentation

## 2018-04-17 MED ORDER — CHLORTHALIDONE 25 MG PO TABS: 25.0000 mg | ORAL_TABLET | Freq: Every day | ORAL | 3 refills | Status: DC

## 2018-04-17 MED ORDER — TRAZODONE HCL 50 MG PO TABS: 50.0000 mg | ORAL_TABLET | Freq: Every day | ORAL | 0 refills | Status: DC

## 2018-04-17 MED FILL — traZODone HCL 50 MG TABS: 50 | 30 days supply | Qty: 30 | Fill #0

## 2018-04-17 NOTE — Telephone Encounter (Signed)
Patient calling and states that he has not received a call from AES Corporationedward Saguier yet. Advised patient that he is currently still seeing patient's and would return his call when he had a free moment.

## 2018-04-17 NOTE — Telephone Encounter (Signed)
Copied from CRM [id number]. Topic: Inquiry >> Apr 17, 2018  9:26 AM Windy KalataMichael, Taylor L, NT wrote: Reason for CRM: patient is calling and states that he just left the ER and states that he was told he is having anxiety attacks. He states the EKG turned out fine but he is wanting to speak to MassillonEdward about this. Patient denied speaking to a nurse.

## 2018-04-17 NOTE — ED Provider Notes (Signed)
[facility] Emergency Department Provider Note MRN:  [id number]  Arrival date & time: 04/17/18     Chief Complaint   Anxiety   History of Present Illness   Rodney Bruce is a 42 y.o. year-old male with a history of anxiety, hypertension, hyperlipidemia, tobacco abuse presenting to the ED with chief complaint of anxiety.  Patient has been seen by his primary care provider recently for ongoing issues with anxiety.  Patient explains that he quit smoking 4 months ago, after deciding to take better care of himself.  Since then he has had worsening anxiety, trouble sleeping due to racing thoughts.  He continuously worries about his state of health, worried that something terrible is going to happen to him.  Worried about having cancer or heart attack or stroke.  Was only able to sleep 2 hours last night due to the racing thoughts.  Has recently been trialed on short course of Ativan as well as clonidine to help him sleep, they help him fall asleep but he seems to wake up and not be able to go back to sleep.  Patient denies significant depression, no SI, no HI, no AVH.  No recent chest pain or shortness of breath, no numbness or weakness in the arms or legs, no headache or vision change, no unintentional weight loss.  Review of Systems  A complete 10 system review of systems was obtained and all systems are negative except as noted in the HPI and PMH.   Patient's Health History    Past Medical History:  Diagnosis Date  . Anxiety   . Bleeding ulcer   . Hyperlipidemia   . Hypertension     Past Surgical History:  Procedure Laterality Date  . TONSILLECTOMY  age 42    Family History  Problem Relation Age of Onset  . Hypertension Mother   . Hyperlipidemia Mother     Social History   Socioeconomic History  . Marital status: Single    Spouse name: Not on file  . Number of children: Not on file  . Years of education: Not on file  . Highest education level:  Not on file  Occupational History  . Not on file  Social Needs  . Financial resource strain: Not on file  . Food insecurity:    Worry: Not on file    Inability: Not on file  . Transportation needs:    Medical: Not on file    Non-medical: Not on file  Tobacco Use  . Smoking status: Former Smoker    Packs/day: 1.00    Years: 20.00    Pack years: 20.00    Types: Cigarettes    Last attempt to quit: 11/23/2017    Years since quitting: 0.3  . Smokeless tobacco: Never Used  Substance and Sexual Activity  . Alcohol use: No  . Drug use: No  . Sexual activity: Yes  Lifestyle  . Physical activity:    Days per week: Not on file    Minutes per session: Not on file  . Stress: Not on file  Relationships  . Social connections:    Talks on phone: Not on file    Gets together: Not on file    Attends religious service: Not on file    Active member of club or organization: Not on file    Attends meetings of clubs or organizations: Not on file    Relationship status: Not on file  . Intimate partner violence:    Fear  of current or ex partner: Not on file    Emotionally abused: Not on file    Physically abused: Not on file    Forced sexual activity: Not on file  Other Topics Concern  . Not on file  Social History Narrative  . Not on file     Physical Exam  Vital Signs and Nursing Notes reviewed Vitals:   04/17/18 0728  BP: (!) 189/99  Pulse: (!) 108  Resp: 14  Temp: 98.7 F (37.1 C)  SpO2: 100%    CONSTITUTIONAL: Well-appearing, NAD NEURO:  Alert and oriented x 3, no focal deficits EYES:  eyes equal and reactive ENT/NECK:  no LAD, no JVD CARDIO: Regular rate, well-perfused, normal S1 and S2 PULM:  CTAB no wheezing or rhonchi GI/GU:  normal bowel sounds, non-distended, non-tender MSK/SPINE:  No gross deformities, no edema SKIN:  no rash, atraumatic PSYCH:  Appropriate speech and behavior  Diagnostic and Interventional Summary    EKG Interpretation  Date/Time:      Ventricular Rate:    PR Interval:    QRS Duration:   QT Interval:    QTC Calculation:   R Axis:     Text Interpretation:        Labs Reviewed - No data to display  No orders to display    Medications - No data to display   Procedures Critical Care  ED Course and Medical Decision Making  I have reviewed the triage vital signs and the nursing notes.  Pertinent labs & imaging results that were available during my care of the patient were reviewed by me and considered in my medical decision making (see below for details).    Continued anxiety in this 42 year old male, trouble sleeping, trouble returning to sleep after waking up at night.  Denies depression, SIGECAPS with exception of sleep troubles.  Will obtain screening EKG today mostly to provide some reassurance for the patient.  Patient not really interested in additional medications, but we did discuss meeting with a therapist to further explore his anxiety and he was interested in this.  After EKG is obtained will provide these resources.  Also discussed importance of sleep hygiene.  After the discussed management above, the patient was determined to be safe for discharge.  The patient was in agreement with this plan and all questions regarding their care were answered.  ED return precautions were discussed and the patient will return to the ED with any significant worsening of condition.  Elmer Sow. Pilar Plate, MD [facility] Emergency Medicine [facility] [email or web address]  Final Clinical Impressions(s) / ED Diagnoses     ICD-10-CM   1. Anxiety F41.9     ED Discharge Orders         Ordered    traZODone (DESYREL) 50 MG tablet  Daily at bedtime     04/17/18 0849             Sabas Sous, MD 04/17/18 [phone]

## 2018-04-17 NOTE — Telephone Encounter (Signed)
Talked with patient and he feeling calm now. Advised to talk buspar twice daily. He was only taking one tab a day.   190/98. No cardiac or neurologic signs or symptoms.  Advised to take extra metroprolol daily. Will add chlorthalidone 25 mg daily(in place hctz). Continue other bp meds the same.  Will plan order renal artery US.  Buspar twice a day. Trazadone tonight. Temporary hold on sertaline which he has not started..  Will write work note excuse for this work. Return to work.  Fax number to send excuse would be [phone].   Follow up this Friday or as needed

## 2018-04-17 NOTE — Telephone Encounter (Signed)
Would you see telephone note conversation. Will you fax his work note to number on that message.

## 2018-04-17 NOTE — ED Triage Notes (Signed)
Pt states he is having anxiety and was given medication on Friday but isnt working.  Pt states he isnt sleeping.

## 2018-04-17 NOTE — Discharge Instructions (Addendum)
You were evaluated in the Emergency Department and after careful evaluation, we did not find any emergent condition requiring admission or further testing in the hospital.  Your symptoms today seem to be due to generalized anxiety disorder.  Please follow-up with a therapist or psychiatrist to further discuss your anxiety.  You can try the medication provided to sleep.  Please return to the Emergency Department if you experience any worsening of your condition.  We encourage you to follow up with a primary care provider.  Thank you for allowing us to be a part of your care.

## 2018-04-18 NOTE — Telephone Encounter (Signed)
Note faxed to pt job  

## 2018-04-21 ENCOUNTER — Encounter: Payer: Self-pay | Admitting: Medical

## 2018-04-21 ENCOUNTER — Ambulatory Visit: Payer: BLUE CROSS/BLUE SHIELD | Admitting: Medical

## 2018-04-21 VITALS — BP 145/92 | HR 73 | Temp 98.4°F | Resp 16 | Ht 70.0 in | Wt 222.2 lb

## 2018-04-21 DIAGNOSIS — I10 Essential (primary) hypertension: Secondary | ICD-10-CM | POA: Diagnosis not present

## 2018-04-21 DIAGNOSIS — F419 Anxiety disorder, unspecified: Secondary | ICD-10-CM | POA: Diagnosis not present

## 2018-04-21 DIAGNOSIS — G4700 Insomnia, unspecified: Secondary | ICD-10-CM | POA: Diagnosis not present

## 2018-04-21 MED ORDER — METOPROLOL SUCCINATE ER 100 MG PO TB24: 100.0000 mg | ORAL_TABLET | Freq: Every day | ORAL | 3 refills | Status: DC

## 2018-04-21 MED ORDER — ZOLPIDEM TARTRATE 5 MG PO TABS: 5.0000 mg | ORAL_TABLET | Freq: Every evening | ORAL | 0 refills | Status: DC | PRN

## 2018-04-21 MED ORDER — FLUTICASONE PROPIONATE 50 MCG/ACT NA SUSP: 2.0000 | Freq: Every day | NASAL | 1 refills | Status: DC

## 2018-04-21 MED FILL — ZOLPIDEM TARTRATE 5 MG TAB: 5 | 15 days supply | Qty: 15 | Fill #0

## 2018-04-21 NOTE — Progress Notes (Signed)
Subjective:    Patient ID: Rodney Bruce, male    DOB: 1976/08/04, 42 y.o.   MRN: [id number]  HPI  Pt in for history of htn that has recently been resistant. He also states recent anxiety and was having insomnia. He was constantly worrying about his blood pressure and other  Potential problems. No cardiac or neurologic signs or symptoms.  On Friday  he added extra metoprolol xl 50 mg 2 tab a day. And I had added chlorthalidone. Pt  On Tuesday  bp was 137/90 and then 130/86.  Yesterday bp 146/90 and another 124/78.  He took [place] last night and he states finally able to sleep. Pt was not able to sleep with trazadone.   Review of Systems  Constitutional: Negative for chills and fever.  HENT: Positive for congestion. Negative for nosebleeds, postnasal drip, rhinorrhea, sinus pressure, sinus pain and sore throat.        Denies snoring.  Respiratory: Negative for apnea, cough, chest tightness, shortness of breath and wheezing.   Cardiovascular: Negative for chest pain and palpitations.  Gastrointestinal: Negative for abdominal pain.  Musculoskeletal: Negative for back pain, joint swelling and neck pain.  Skin: Negative for pallor and rash.  Neurological: Negative for dizziness and headaches.  Hematological: Negative for adenopathy. Does not bruise/bleed easily.  Psychiatric/Behavioral: Positive for sleep disturbance. Negative for behavioral problems, confusion and suicidal ideas. The patient is nervous/anxious.        He thinks buspar is helping his anxiety now.    Past Medical History:  Diagnosis Date  . Anxiety   . Bleeding ulcer   . Hyperlipidemia   . Hypertension      Social History   Socioeconomic History  . Marital status: Single    Spouse name: Not on file  . Number of children: Not on file  . Years of education: Not on file  . Highest education level: Not on file  Occupational History  . Not on file  Social Needs  . Financial resource strain: Not on file    . Food insecurity:    Worry: Not on file    Inability: Not on file  . Transportation needs:    Medical: Not on file    Non-medical: Not on file  Tobacco Use  . Smoking status: Former Smoker    Packs/day: 1.00    Years: 20.00    Pack years: 20.00    Types: Cigarettes    Last attempt to quit: 11/23/2017    Years since quitting: 0.4  . Smokeless tobacco: Never Used  Substance and Sexual Activity  . Alcohol use: No  . Drug use: No  . Sexual activity: Yes  Lifestyle  . Physical activity:    Days per week: Not on file    Minutes per session: Not on file  . Stress: Not on file  Relationships  . Social connections:    Talks on phone: Not on file    Gets together: Not on file    Attends religious service: Not on file    Active member of club or organization: Not on file    Attends meetings of clubs or organizations: Not on file    Relationship status: Not on file  . Intimate partner violence:    Fear of current or ex partner: Not on file    Emotionally abused: Not on file    Physically abused: Not on file    Forced sexual activity: Not on file  Other Topics Concern  .  Not on file  Social History Narrative  . Not on file    Past Surgical History:  Procedure Laterality Date  . TONSILLECTOMY  age 42    Family History  Problem Relation Age of Onset  . Hypertension Mother   . Hyperlipidemia Mother     No Known Allergies  Current Outpatient Medications on File Prior to Visit  Medication Sig Dispense Refill  . amLODipine (NORVASC) 10 MG tablet TAKE 1 TABLET(10 MG) BY MOUTH DAILY 90 tablet 0  . atorvastatin (LIPITOR) 20 MG tablet Take 1 tablet (20 mg total) by mouth daily. 90 tablet 1  . busPIRone (BUSPAR) 15 MG tablet Take 1 tablet (15 mg total) by mouth 3 (three) times daily. 30 tablet 0  . chlorthalidone (HYGROTON) 25 MG tablet Take 1 tablet (25 mg total) by mouth daily. 30 tablet 3  . cloNIDine (CATAPRES) 0.1 MG tablet Take 1 tablet (0.1 mg total) by mouth 2 (two)  times daily. 60 tablet 5  . hydrochlorothiazide (HYDRODIURIL) 25 MG tablet TAKE 1 TABLET(25 MG) BY MOUTH DAILY 90 tablet 0  . LORazepam (ATIVAN) 1 MG tablet Take 1 tablet (1 mg total) by mouth 3 (three) times daily as needed for anxiety. 15 tablet 0  . losartan (COZAAR) 100 MG tablet TAKE 1 TABLET(100 MG) BY MOUTH DAILY 90 tablet 0  . metoprolol succinate (TOPROL-XL) 50 MG 24 hr tablet TAKE 1 TABLET(50 MG) BY MOUTH DAILY 90 tablet 0  . sertraline (ZOLOFT) 25 MG tablet Take 1 tablet (25 mg total) by mouth daily. 30 tablet 0  . sildenafil (VIAGRA) 100 MG tablet Take 1 tablet (100 mg total) by mouth daily as needed for erectile dysfunction. 10 tablet 0  . traZODone (DESYREL) 50 MG tablet Take 1 tablet (50 mg total) by mouth at bedtime. 30 tablet 0   No current facility-administered medications on file prior to visit.     BP (!) 145/92 Comment: 150/90  Pulse 73   Temp 98.4 F (36.9 C) (Oral)   Resp 16   Ht 5\' 10"  (1.778 m)   Wt 222 lb 3.2 oz (100.8 kg)   SpO2 100%   PF 73 L/min   BMI 31.88 kg/m       Objective:   Physical Exam  General Mental Status- Alert. General Appearance- Not in acute distress.   Skin General: Color- Normal Color. Moisture- Normal Moisture.  Neck Carotid Arteries- Normal color. Moisture- Normal Moisture. No carotid bruits. No JVD.  Chest and Lung Exam Auscultation: Breath Sounds:-Normal.  Cardiovascular Auscultation:Rythm- Regular. Murmurs & Other Heart Sounds:Auscultation of the heart reveals- No Murmurs.  Abdomen Inspection:-Inspeection Normal. Palpation/Percussion:Note:No mass. Palpation and Percussion of the abdomen reveal- Non Tender, Non Distended + BS, no rebound or guarding.  Neurologic Cranial Nerve exam:- CN III-XII intact(No nystagmus), symmetric smile. Strength:- 5/5 equal and symmetric strength both upper and lower extremities.   HEENT Head- Normal. Ear Auditory Canal - Left- Normal. Right - Normal.Tympanic Membrane- Left-  Normal. Right- Normal. Eye Sclera/Conjunctiva- Left- Normal. Right- Normal. Nose & Sinuses Nasal Mucosa- Left-  Boggy and Congested. Right-  Boggy and  Congested.Bilateral no  maxillary and no  frontal sinus pressure. Mouth & Throat Lips: Upper Lip- Normal: no dryness, cracking, pallor, cyanosis, or vesicular eruption. Lower Lip-Normal: no dryness, cracking, pallor, cyanosis or vesicular eruption. Buccal Mucosa- Bilateral- No Aphthous ulcers. Oropharynx- No Discharge or Erythema. Tonsils: Characteristics- Bilateral- No Erythema or Congestion. Size/Enlargement- Bilateral- No enlargement. Discharge- bilateral-None.  Assessment & Plan:  Your blood pressure is much better than it has been.  I do think increasing the metoprolol to 2 tablets a day has helped.  Also adding chlorthalidone appears to have helped.  Continue other medications for blood pressure the same.  For recent anxiety, do recommend that you continue the BuSpar.  For insomnia, it appears Ambien did help you sleep.  So I am going to give you that this prescription Ambien 5 mg.  Try not to resort this every night.  I would propose that she use it every third night if the prior to night she has not slept well.  For recent nasal congestion rx' flonase. Possible allergic rhinitis.  Follow-up in 3 weeks or as needed.  Esperanza Richters, PA-C

## 2018-04-21 NOTE — Patient Instructions (Addendum)
Your blood pressure is much better than it has been.  I do think increasing the metoprolol to 2 tablets a day has helped.  Also adding chlorthalidone appears to have helped.  Continue other medications for blood pressure the same.  For recent anxiety, do recommend that you continue the BuSpar.  For insomnia, it appears Ambien did help you sleep.  So I am going to give you that this prescription Ambien 5 mg.  Try not to resort this every night.  I would propose that she use it every third night if the prior to night she has not slept well.  For recent nasal congestion rx' flonase. Possible allergic rhinitis.  Follow-up in 3 weeks or as needed.

## 2018-04-25 ENCOUNTER — Ambulatory Visit: Payer: BLUE CROSS/BLUE SHIELD | Admitting: Medical

## 2018-04-25 ENCOUNTER — Telehealth: Payer: Self-pay

## 2018-04-25 DIAGNOSIS — G4709 Other insomnia: Secondary | ICD-10-CM

## 2018-04-25 NOTE — Telephone Encounter (Signed)
I see your note but don't see his response to question regarding how he is sleeping?

## 2018-04-25 NOTE — Telephone Encounter (Signed)
Pt states below he is still having trouble sleeping.

## 2018-04-25 NOTE — Telephone Encounter (Signed)
If he is still having problems sleeping even with the Remus Loffler the will refer to pulmonologist. They can give opinion on insomnia treatment and give opinion if sleep study indicated. Htn can be asociated with poor sleep. And sleep study possibly indicated.

## 2018-04-25 NOTE — Telephone Encounter (Signed)
Copied from CRM [id number]. Topic: General - Other >> Apr 25, 2018  8:52 AM Rodney Bruce wrote: Reason for CRM: Patient wants [facility] to know he is still having trouble sleeping and would like a call  back.

## 2018-04-27 NOTE — Telephone Encounter (Signed)
Pt states he went to a walk in mental health clinic and Buspar was dose to 20mg   Takes two and a half tablets. Pt states he has been sleeping better.

## 2018-04-27 NOTE — Telephone Encounter (Signed)
Would you clarify the dose of buspar. I had written him 15 mg tab. So what dose is he taking if he is taking 2.5 tabs. That would be in excess of 30 mg.. How would he be taking 20 mg?  I still think pulmonologist is a good idea. If his sleep normalizes before that appointment then could cancel,

## 2018-04-28 NOTE — Telephone Encounter (Signed)
Pt states Buspar is still 15mg  he is just add a half at tablet. And he is ok with referral

## 2018-05-05 ENCOUNTER — Ambulatory Visit: Payer: BLUE CROSS/BLUE SHIELD | Admitting: Medical

## 2018-05-05 ENCOUNTER — Encounter: Payer: Self-pay | Admitting: Medical

## 2018-05-05 VITALS — BP 140/80 | HR 71 | Temp 98.4°F | Ht 70.0 in | Wt 220.2 lb

## 2018-05-05 DIAGNOSIS — J3489 Other specified disorders of nose and nasal sinuses: Secondary | ICD-10-CM | POA: Diagnosis not present

## 2018-05-05 DIAGNOSIS — I10 Essential (primary) hypertension: Secondary | ICD-10-CM | POA: Diagnosis not present

## 2018-05-05 DIAGNOSIS — R102 Pelvic and perineal pain: Secondary | ICD-10-CM

## 2018-05-05 DIAGNOSIS — R350 Frequency of micturition: Secondary | ICD-10-CM | POA: Diagnosis not present

## 2018-05-05 LAB — CBC WITH DIFFERENTIAL/PLATELET
BASOS ABS: 0.1 10*3/uL (ref 0.0–0.1)
Basophils Relative: 0.5 % (ref 0.0–3.0)
Eosinophils Absolute: 0 10*3/uL (ref 0.0–0.7)
Eosinophils Relative: 0.3 % (ref 0.0–5.0)
HCT: 40 % (ref 39.0–52.0)
Hemoglobin: 13.2 g/dL (ref 13.0–17.0)
Lymphocytes Relative: 25.4 % (ref 12.0–46.0)
Lymphs Abs: 3 10*3/uL (ref 0.7–4.0)
MCHC: 33 g/dL (ref 30.0–36.0)
MCV: 82.8 fl (ref 78.0–100.0)
MONO ABS: 1.1 10*3/uL — AB (ref 0.1–1.0)
MONOS PCT: 9.4 % (ref 3.0–12.0)
NEUTROS ABS: 7.5 10*3/uL (ref 1.4–7.7)
NEUTROS PCT: 64.4 % (ref 43.0–77.0)
PLATELETS: 329 10*3/uL (ref 150.0–400.0)
RBC: 4.83 Mil/uL (ref 4.22–5.81)
RDW: 13.3 % (ref 11.5–15.5)
WBC: 11.7 10*3/uL — ABNORMAL HIGH (ref 4.0–10.5)

## 2018-05-05 LAB — POCT URINALYSIS DIPSTICK
BILIRUBIN UA: NEGATIVE
Blood, UA: NEGATIVE
Glucose, UA: NEGATIVE
KETONES UA: NEGATIVE
Leukocytes, UA: NEGATIVE
Nitrite, UA: NEGATIVE
PROTEIN UA: NEGATIVE
SPEC GRAV UA: 1.015 (ref 1.010–1.025)
Urobilinogen, UA: 0.2 E.U./dL
pH, UA: 6 (ref 5.0–8.0)

## 2018-05-05 LAB — PSA: PSA: 0.26 ng/mL (ref 0.10–4.00)

## 2018-05-05 MED ORDER — SULFAMETHOXAZOLE-TRIMETHOPRIM 800-160 MG PO TABS: 1.0000 | ORAL_TABLET | Freq: Two times a day (BID) | ORAL | 0 refills | Status: DC

## 2018-05-05 MED FILL — SULFAMETHOXAZOLE-TMP DS TAB: 800-160 | 10 days supply | Qty: 20 | Fill #0

## 2018-05-05 NOTE — Patient Instructions (Signed)
You do appear to have some recent signs and symptoms suspicious for possible prostatitis versus urinary tract infection, I am prescribing the Bactrim DS antibiotic.  We will get a urine culture, CBC and a PSA.  We will follow those results and let you know those when they are in.  You have some recent nasal congestion with probable allergic rhinitis component.  Now describing some sinus pressure/possible sinus infection.  Continue Flonase nasal spray.  Bactrim DS antibiotic has some good coverage of the sinuses as well.  Your blood pressure is much better controlled than it has been recently.  Continue current blood pressure regimen.  For your anxiety, continue BuSpar as your anxiety appears to becoming under control.  Follow-up in 7 to 10 days or as needed.

## 2018-05-05 NOTE — Progress Notes (Signed)
Subjective:    Patient ID: Rodney Bruce, male    DOB: [birth year], 42 y.o.   MRN: [id number]  HPI  Pt in for follow up.  Pt states overall his anxiety is getting better.   Pt states he has more frequent urination and smaller volumes for about 10 days. Pt states no fever or chills. But he states will get occasional cold sweats randomly. Pt reports no back pain. Some perineum pain sensation just recently. States feels like could not relax in that area. But over past day feels some better.   Also some sinus pressure recently. Pt has been using flonase but still has sinus pressure.    Review of Systems  Constitutional: Negative for chills, fatigue and fever.  HENT: Positive for congestion, sinus pressure, sinus pain and sneezing. Negative for facial swelling.   Respiratory: Negative for cough, chest tightness, shortness of breath and wheezing.   Cardiovascular: Negative for chest pain.  Gastrointestinal: Negative for abdominal distention, anal bleeding and constipation.  Genitourinary: Positive for frequency and urgency. Negative for difficulty urinating, dysuria, penile pain and scrotal swelling.       Smaller volumes. With some perineum pain.  Neurological: Negative for dizziness, light-headedness and headaches.  Hematological: Negative for adenopathy. Does not bruise/bleed easily.  Psychiatric/Behavioral: Positive for sleep disturbance. Negative for behavioral problems, decreased concentration and suicidal ideas. The patient is nervous/anxious.        Anxiety is better.    Past Medical History:  Diagnosis Date  . Anxiety   . Bleeding ulcer   . Hyperlipidemia   . Hypertension      Social History   Socioeconomic History  . Marital status: Single    Spouse name: Not on file  . Number of children: Not on file  . Years of education: Not on file  . Highest education level: Not on file  Occupational History  . Not on file  Social Needs  . Financial resource strain:  Not on file  . Food insecurity:    Worry: Not on file    Inability: Not on file  . Transportation needs:    Medical: Not on file    Non-medical: Not on file  Tobacco Use  . Smoking status: Former Smoker    Packs/day: 1.00    Years: 20.00    Pack years: 20.00    Types: Cigarettes    Last attempt to quit: 11/23/2017    Years since quitting: 0.4  . Smokeless tobacco: Never Used  Substance and Sexual Activity  . Alcohol use: No  . Drug use: No  . Sexual activity: Yes  Lifestyle  . Physical activity:    Days per week: Not on file    Minutes per session: Not on file  . Stress: Not on file  Relationships  . Social connections:    Talks on phone: Not on file    Gets together: Not on file    Attends religious service: Not on file    Active member of club or organization: Not on file    Attends meetings of clubs or organizations: Not on file    Relationship status: Not on file  . Intimate partner violence:    Fear of current or ex partner: Not on file    Emotionally abused: Not on file    Physically abused: Not on file    Forced sexual activity: Not on file  Other Topics Concern  . Not on file  Social History Narrative  . Not on  file    Past Surgical History:  Procedure Laterality Date  . TONSILLECTOMY  age 47    Family History  Problem Relation Age of Onset  . Hypertension Mother   . Hyperlipidemia Mother     No Known Allergies  Current Outpatient Medications on File Prior to Visit  Medication Sig Dispense Refill  . amLODipine (NORVASC) 10 MG tablet TAKE 1 TABLET(10 MG) BY MOUTH DAILY 90 tablet 0  . atorvastatin (LIPITOR) 20 MG tablet Take 1 tablet (20 mg total) by mouth daily. 90 tablet 1  . busPIRone (BUSPAR) 15 MG tablet Take 1 tablet (15 mg total) by mouth 3 (three) times daily. 30 tablet 0  . chlorthalidone (HYGROTON) 25 MG tablet Take 1 tablet (25 mg total) by mouth daily. 30 tablet 3  . cloNIDine (CATAPRES) 0.1 MG tablet Take 1 tablet (0.1 mg total) by mouth  2 (two) times daily. 60 tablet 5  . fluticasone (FLONASE) 50 MCG/ACT nasal spray Place 2 sprays into both nostrils daily. 16 g 1  . hydrochlorothiazide (HYDRODIURIL) 25 MG tablet TAKE 1 TABLET(25 MG) BY MOUTH DAILY 90 tablet 0  . losartan (COZAAR) 100 MG tablet TAKE 1 TABLET(100 MG) BY MOUTH DAILY 90 tablet 0  . metoprolol succinate (TOPROL-XL) 100 MG 24 hr tablet Take 1 tablet (100 mg total) by mouth daily. Take with or immediately following a meal. 30 tablet 3  . sildenafil (VIAGRA) 100 MG tablet Take 1 tablet (100 mg total) by mouth daily as needed for erectile dysfunction. 10 tablet 0  . zolpidem (AMBIEN) 5 MG tablet Take 1 tablet (5 mg total) by mouth at bedtime as needed for sleep. 15 tablet 0   No current facility-administered medications on file prior to visit.     BP 140/80   Pulse 71   Temp 98.4 F (36.9 C) (Oral)   Ht 5\' 10"  (1.778 m)   Wt 220 lb 3.2 oz (99.9 kg)   SpO2 99%   BMI 31.60 kg/m       Objective:   Physical Exam  General Mental Status- Alert. General Appearance- Not in acute distress.   Skin General: Color- Normal Color. Moisture- Normal Moisture.  Neck Carotid Arteries- Normal color. Moisture- Normal Moisture. No carotid bruits. No JVD.  Chest and Lung Exam Auscultation: Breath Sounds:-Normal.  Cardiovascular Auscultation:Rythm- Regular. Murmurs & Other Heart Sounds:Auscultation of the heart reveals- No Murmurs.  Abdomen Inspection:-Inspeection Normal. Palpation/Percussion:Note:No mass. Palpation and Percussion of the abdomen reveal- faint suprapubic tender Tender, Non Distended + BS, no rebound or guarding.    Neurologic Cranial Nerve exam:- CN III-XII intact(No nystagmus), symmetric smile. Strength:- 5/5 equal and symmetric strength both upper and lower extremities.    HEENT Head- Normal. Ear Auditory Canal - Left- Normal. Right - Normal.Tympanic Membrane- Left- Normal. Right- Normal. Eye Sclera/Conjunctiva- Left- Normal. Right-  Normal. Nose & Sinuses Nasal Mucosa- Left-  Boggy and Congested. Right-  Boggy and  Congested.Bilateral no maxillary and   Mild frontal/ethmoid sinus pressure. Mouth & Throat Lips: Upper Lip- Normal: no dryness, cracking, pallor, cyanosis, or vesicular eruption. Lower Lip-Normal: no dryness, cracking, pallor, cyanosis or vesicular eruption. Buccal Mucosa- Bilateral- No Aphthous ulcers. Oropharynx- No Discharge or Erythema. Tonsils: Characteristics- Bilateral- No Erythema or Congestion. Size/Enlargement- Bilateral- No enlargement. Discharge- bilateral-None.            Assessment & Plan:  You do appear to have some recent signs and symptoms suspicious for possible prostatitis versus urinary tract infection, I am prescribing the Bactrim DS antibiotic.  We will get a urine culture, CBC and a PSA.  We will follow those results and let you know those when they are in.  You have some recent nasal congestion with probable allergic rhinitis component.  Now describing some sinus pressure/possible sinus infection.  Continue Flonase nasal spray.  Bactrim DS antibiotic has some good coverage of the sinuses as well.  Your blood pressure is much better controlled than it has been recently.  Continue current blood pressure regimen.  For your anxiety, continue BuSpar as your anxiety appears to becoming under control.  Follow-up in 7 to 10 days or as needed.  Esperanza RichtersEdward Haru Shaff, PA-C

## 2018-05-06 ENCOUNTER — Encounter: Payer: Self-pay | Admitting: Medical

## 2018-05-06 LAB — URINE CULTURE
MICRO NUMBER:: 91100345
Result:: NO GROWTH
SPECIMEN QUALITY: ADEQUATE

## 2018-05-10 ENCOUNTER — Telehealth: Payer: Self-pay | Admitting: Medical

## 2018-05-10 MED ORDER — ATORVASTATIN CALCIUM 20 MG PO TABS: 20.0000 mg | ORAL_TABLET | Freq: Every day | ORAL | 1 refills | Status: DC

## 2018-05-10 MED ORDER — HYDROCHLOROTHIAZIDE 25 MG PO TABS: ORAL_TABLET | ORAL | 0 refills | Status: DC

## 2018-05-10 MED ORDER — AMLODIPINE BESYLATE 10 MG PO TABS: ORAL_TABLET | ORAL | 0 refills | Status: DC

## 2018-05-10 MED ORDER — LOSARTAN POTASSIUM 100 MG PO TABS: ORAL_TABLET | ORAL | 0 refills | Status: DC

## 2018-05-10 NOTE — Telephone Encounter (Signed)
Copied from CRM [id number]. Topic: Quick Communication - Rx Refill/Question >> May 10, 2018  8:12 AM Maia Pettiesrtiz, Kristie S wrote: Medication: atorvastatin, amlodipine, hctz, losartan - pt has 3 days of each and no refills remaning Has the patient contacted their pharmacy? yes Preferred Pharmacy (with phone number or street name): [id number] - [place], [place] - [address] [phone] (Phone) [phone] (Fax)

## 2018-05-10 NOTE — Telephone Encounter (Signed)
Atorvastatin 20 mg refill Last Refill:05/19/17 # 90 and one refill Last OV: 05/05/18 PCP: Bea LauraE. Saguier Pharmacy: [facility]  [phone]#[id number]  Amlodipine 10 mg refill Last Refill:12/16/17 # 90  Hydrochlorothiazide 25 mg refill Last Refill:12/16/17 # 90  Losartan 100 mg  refill Last Refill:12/16/17 # 90  Last lipid panel was 04/11/18.

## 2018-05-17 ENCOUNTER — Encounter: Payer: Self-pay | Admitting: Medical

## 2018-05-24 ENCOUNTER — Ambulatory Visit: Payer: BLUE CROSS/BLUE SHIELD | Admitting: Medical

## 2018-05-24 ENCOUNTER — Other Ambulatory Visit (HOSPITAL_COMMUNITY)
Admission: RE | Admit: 2018-05-24 | Discharge: 2018-05-24 | Disposition: A | Discharge status: Home / Self Care | Payer: BLUE CROSS/BLUE SHIELD | Source: Ambulatory Visit | Attending: Medical | Admitting: Medical

## 2018-05-24 ENCOUNTER — Ambulatory Visit (HOSPITAL_BASED_OUTPATIENT_CLINIC_OR_DEPARTMENT_OTHER)
Admission: RE | Admit: 2018-05-24 | Discharge: 2018-05-24 | Disposition: A | Discharge status: Home / Self Care | Payer: BLUE CROSS/BLUE SHIELD | Source: Ambulatory Visit | Attending: Medical | Admitting: Medical

## 2018-05-24 ENCOUNTER — Encounter: Payer: Self-pay | Admitting: Medical

## 2018-05-24 VITALS — BP 130/70 | HR 76 | Temp 98.0°F | Resp 16 | Ht 70.0 in | Wt 216.8 lb

## 2018-05-24 DIAGNOSIS — Z87891 Personal history of nicotine dependence: Secondary | ICD-10-CM | POA: Diagnosis not present

## 2018-05-24 DIAGNOSIS — J309 Allergic rhinitis, unspecified: Secondary | ICD-10-CM

## 2018-05-24 DIAGNOSIS — G2581 Restless legs syndrome: Secondary | ICD-10-CM

## 2018-05-24 DIAGNOSIS — J9811 Atelectasis: Secondary | ICD-10-CM | POA: Insufficient documentation

## 2018-05-24 DIAGNOSIS — Z113 Encounter for screening for infections with a predominantly sexual mode of transmission: Secondary | ICD-10-CM | POA: Diagnosis not present

## 2018-05-24 MED ORDER — LEVOCETIRIZINE DIHYDROCHLORIDE 5 MG PO TABS: 5.0000 mg | ORAL_TABLET | Freq: Every evening | ORAL | 0 refills | Status: DC

## 2018-05-24 MED ORDER — GABAPENTIN 100 MG PO CAPS: 100.0000 mg | ORAL_CAPSULE | Freq: Three times a day (TID) | ORAL | 0 refills | Status: DC

## 2018-05-24 MED ORDER — METHYLPREDNISOLONE ACETATE 40 MG/ML IJ SUSP
40.0000 mg | Freq: Once | INTRAMUSCULAR | Status: AC
  Administered 2018-05-24: 40 mg via INTRAMUSCULAR

## 2018-05-24 MED FILL — LEVOCETIRIZINE 5 MG TABLET: 5 | 30 days supply | Qty: 30 | Fill #0

## 2018-05-24 MED FILL — GABAPENTIN 100 MG CAPSULE: 100 | 30 days supply | Qty: 90 | Fill #0

## 2018-05-24 NOTE — Progress Notes (Signed)
Subjective:    Patient ID: [name], male    DOB: 1976-07-04, 42 y.o.   MRN: [id number]  HPI  Pt describes that he is having some sensation of restless legs. He states for years his legs he has constant low level achiness to both legs. He states he feels like he has to move legs all the time.  Pt also states still has some nasal congestion and constant pnd as well. No sneezing or coughing. Pt has been using flonase.But still has symptoms.  Pt is still having some insomnia. Despite using ambien.   Pt has htn. At home readings 136/82, 143/82, 152/88,144/86,134/81, and  136/77.  Pt request std screening.   Review of Systems  Constitutional: Negative for chills, fatigue and fever.  HENT: Positive for congestion and postnasal drip. Negative for ear pain, sinus pressure and sinus pain.   Respiratory: Negative for chest tightness, shortness of breath and wheezing.        No wheezing or sob. But pt does have concern for copd since he was former smoker.  Cardiovascular: Negative for chest pain and palpitations.  Gastrointestinal: Negative for abdominal pain.  Musculoskeletal: Negative for back pain.  Skin: Negative for rash.  Neurological: Negative for dizziness, weakness and light-headedness.  Hematological: Negative for adenopathy. Does not bruise/bleed easily.  Psychiatric/Behavioral: Positive for sleep disturbance. Negative for behavioral problems, confusion and suicidal ideas. The patient is nervous/anxious.    Past Medical History:  Diagnosis Date  . Anxiety   . Bleeding ulcer   . Hyperlipidemia   . Hypertension      Social History   Socioeconomic History  . Marital status: Single    Spouse name: Not on file  . Number of children: Not on file  . Years of education: Not on file  . Highest education level: Not on file  Occupational History  . Not on file  Social Needs  . Financial resource strain: Not on file  . Food insecurity:    Worry: Not on file   Inability: Not on file  . Transportation needs:    Medical: Not on file    Non-medical: Not on file  Tobacco Use  . Smoking status: Former Smoker    Packs/day: 1.00    Years: 20.00    Pack years: 20.00    Types: Cigarettes    Last attempt to quit: 11/23/2017    Years since quitting: 0.4  . Smokeless tobacco: Never Used  Substance and Sexual Activity  . Alcohol use: No  . Drug use: No  . Sexual activity: Yes  Lifestyle  . Physical activity:    Days per week: Not on file    Minutes per session: Not on file  . Stress: Not on file  Relationships  . Social connections:    Talks on phone: Not on file    Gets together: Not on file    Attends religious service: Not on file    Active member of club or organization: Not on file    Attends meetings of clubs or organizations: Not on file    Relationship status: Not on file  . Intimate partner violence:    Fear of current or ex partner: Not on file    Emotionally abused: Not on file    Physically abused: Not on file    Forced sexual activity: Not on file  Other Topics Concern  . Not on file  Social History Narrative  . Not on file    Past Surgical  History:  Procedure Laterality Date  . TONSILLECTOMY  age 58    Family History  Problem Relation Age of Onset  . Hypertension Mother   . Hyperlipidemia Mother     No Known Allergies  Current Outpatient Medications on File Prior to Visit  Medication Sig Dispense Refill  . amLODipine (NORVASC) 10 MG tablet TAKE 1 TABLET(10 MG) BY MOUTH DAILY 90 tablet 0  . atorvastatin (LIPITOR) 20 MG tablet Take 1 tablet (20 mg total) by mouth daily. 90 tablet 1  . busPIRone (BUSPAR) 15 MG tablet Take 1 tablet (15 mg total) by mouth 3 (three) times daily. (Patient taking differently: Take 1 tablet (15 mg) by mouth in the morning, take 1/2 tablet (7.5 mg) around 2pm, take 1 tablet (15 mg) in the evening. Daily total (37.5 mg)) 30 tablet 0  . chlorthalidone (HYGROTON) 25 MG tablet Take 1 tablet (25  mg total) by mouth daily. 30 tablet 3  . cloNIDine (CATAPRES) 0.1 MG tablet Take 1 tablet (0.1 mg total) by mouth 2 (two) times daily. 60 tablet 5  . fluticasone (FLONASE) 50 MCG/ACT nasal spray Place 2 sprays into both nostrils daily. 16 g 1  . hydrochlorothiazide (HYDRODIURIL) 25 MG tablet TAKE 1 TABLET(25 MG) BY MOUTH DAILY 90 tablet 0  . losartan (COZAAR) 100 MG tablet TAKE 1 TABLET(100 MG) BY MOUTH DAILY 90 tablet 0  . metoprolol succinate (TOPROL-XL) 100 MG 24 hr tablet Take 1 tablet (100 mg total) by mouth daily. Take with or immediately following a meal. 30 tablet 3  . sildenafil (VIAGRA) 100 MG tablet Take 1 tablet (100 mg total) by mouth daily as needed for erectile dysfunction. 10 tablet 0  . sulfamethoxazole-trimethoprim (BACTRIM DS,SEPTRA DS) 800-160 MG tablet Take 1 tablet by mouth 2 (two) times daily. 20 tablet 0  . zolpidem (AMBIEN) 5 MG tablet Take 1 tablet (5 mg total) by mouth at bedtime as needed for sleep. 15 tablet 0   No current facility-administered medications on file prior to visit.     Pulse 76   Temp 98 F (36.7 C) (Oral)   Resp 16   Ht 5\' 10"  (1.778 m)   Wt 216 lb 12.8 oz (98.3 kg)   SpO2 100%   BMI 31.11 kg/m       Objective:   Physical Exam  General Mental Status- Alert. General Appearance- Not in acute distress.   Skin General: Color- Normal Color. Moisture- Normal Moisture.  Neck Carotid Arteries- Normal color. Moisture- Normal Moisture. No carotid bruits. No JVD.  Chest and Lung Exam Auscultation: Breath Sounds:-Normal.  Cardiovascular Auscultation:Rythm- Regular. Murmurs & Other Heart Sounds:Auscultation of the heart reveals- No Murmurs.  Abdomen Inspection:-Inspeection Normal. Palpation/Percussion:Note:No mass. Palpation and Percussion of the abdomen reveal- Non Tender, Non Distended + BS, no rebound or guarding.   Neurologic Cranial Nerve exam:- CN III-XII intact(No nystagmus), symmetric smile. Strength:- 5/5 equal and symmetric  strength both upper and lower extremities.  Lower ext bilateraly- normal feet. No color color. Good cap refill. Normal pulses.       Assessment & Plan:  You do appear to have restless leg syndrome type symptoms.  You also report some achiness of your legs as well.  I do think gabapentin would be a good option to start.  Prescription states to take 3 times daily.  However I do want you to start using just one at night for the first week then advance by 1 tablet weekly provided no severe sedation side effect.  Then  at 3 weeks can start 3 times daily.  You do appear to have allergic rhinitis type symptoms.  I am prescribing Xyzal antihistamine and you can continue Flonase nasal spray.  Today we also gave you Depo-Medrol 40 mg IM.  Your blood pressure is well controlled and recommend continue current regimen.  For STD screening, I did place order for you to get HIV, RPR and urine ancillary STD studies.  For history of smoking, you can get chest x-ray today.  Note with starting the gabapentin and Xyzal, I do think you could try not using the Ambien as both of these medications might help you sleep.  Follow-up 3 weeks or as needed.  Esperanza Richters, PA-C

## 2018-05-24 NOTE — Patient Instructions (Addendum)
You do appear to have restless leg syndrome type symptoms.  You also report some achiness of your legs as well.  I do think gabapentin would be a good option to start.  Prescription states to take 3 times daily.  However I do want you to start using just one at night for the first week then advance by 1 tablet weekly provided no severe sedation side effect.  Then at 3 weeks can start 3 times daily.  You do appear to have allergic rhinitis type symptoms.  I am prescribing Xyzal antihistamine and you can continue Flonase nasal spray.  Today we also gave you Depo-Medrol 40 mg IM.  Your blood pressure is well controlled and recommend continue current regimen.  For STD screening, I did place order for you to get HIV, RPR and urine ancillary STD studies.  For history of smoking, you can get chest x-ray today.  Note with starting the gabapentin and Xyzal, I do think you could try not using the Ambien as both of these medications might help you sleep.  Follow-up 3 weeks or as needed.

## 2018-05-25 ENCOUNTER — Telehealth: Payer: Self-pay | Admitting: Medical

## 2018-05-25 LAB — URINE CYTOLOGY ANCILLARY ONLY
Chlamydia: NEGATIVE
Neisseria Gonorrhea: NEGATIVE
Trichomonas: NEGATIVE

## 2018-05-25 LAB — RPR: RPR: NONREACTIVE

## 2018-05-25 LAB — HIV ANTIBODY (ROUTINE TESTING W REFLEX): HIV 1&2 Ab, 4th Generation: NONREACTIVE

## 2018-05-25 NOTE — Telephone Encounter (Signed)
Also syphilis, gonorrhea, chlamydia and trichomonas were all negative. Notify pt. Forgot to mention that on lab. Only stated hiv was negative.

## 2018-05-26 NOTE — Telephone Encounter (Signed)
Pt.notified

## 2018-05-29 ENCOUNTER — Telehealth: Payer: Self-pay | Admitting: Medical

## 2018-05-29 NOTE — Telephone Encounter (Signed)
Copied from CRM [id number]. Topic: Quick Communication - See Telephone Encounter >> May 29, 2018  2:59 PM Arlyss Gandy, NT wrote: CRM for notification. See Telephone encounter for: 05/29/18. Pt would like to speak with Rodney Bruce about the medications prescribed from his mental health doctor.

## 2018-05-29 NOTE — Telephone Encounter (Signed)
I talk with patient today.  He had some concerns about his psychiatrist NP adding medications to his anxiety and insomnia regimen.  He states that NP recommend he continue BuSpar at the same dose and not decrease.  In addition he states Prozac, hydroxyzine and Klonopin was added.  Patient expressed reservations about adding so many medications.  He expressed to me that his main problem was anxiety and insomnia and he was considering just using Klonopin.  I did explain to him that that was okay and to update me in a week on how he was doing.  Also explained to him that he could call the nurse practitioner in a week and let them know that he just started off with Klonopin.  I explained to him that this way if he were to have any side effects on potential medications he would know which medication was causing the side effect.  In addition if he felt well with just adding that the one medication it might indicate other medications not necessary.  I did encourage him to keep his therapist appointment and not to get discouraged with psychiatrist.  I explained to him that if he felt like his anxiety was completely controlled and he did not want to see a specialist in the future then he could only see me.  But in the past he did have some severe anxiety and presently it probably is a good idea for him to have a psychiatrist available.

## 2018-05-31 ENCOUNTER — Encounter: Payer: Self-pay | Admitting: Pulmonary Disease

## 2018-05-31 ENCOUNTER — Encounter: Payer: Self-pay | Admitting: Medical

## 2018-05-31 ENCOUNTER — Ambulatory Visit (INDEPENDENT_AMBULATORY_CARE_PROVIDER_SITE_OTHER): Payer: BLUE CROSS/BLUE SHIELD | Admitting: Pulmonary Disease

## 2018-05-31 VITALS — BP 116/74 | HR 74 | Ht 70.0 in | Wt 215.0 lb

## 2018-05-31 DIAGNOSIS — F5104 Psychophysiologic insomnia: Secondary | ICD-10-CM

## 2018-05-31 DIAGNOSIS — D649 Anemia, unspecified: Secondary | ICD-10-CM | POA: Diagnosis not present

## 2018-05-31 DIAGNOSIS — R0683 Snoring: Secondary | ICD-10-CM

## 2018-05-31 DIAGNOSIS — G2581 Restless legs syndrome: Secondary | ICD-10-CM | POA: Diagnosis not present

## 2018-05-31 MED ORDER — MIRTAZAPINE 30 MG PO TABS: 30.0000 mg | ORAL_TABLET | Freq: Every day | ORAL | 1 refills | Status: DC

## 2018-05-31 MED ORDER — FLUTTER DEVI: 1.0000 | Freq: Three times a day (TID) | 0 refills | Status: DC

## 2018-05-31 NOTE — Progress Notes (Signed)
[facility]  Chief Complaint  Patient presents with  . sleep consult    Pt stopped smoking 62mo ago, it has increased aniexty. Pt cannot fall asleep hardly at all, maybe 1-2 hours. Pt states no REM sleep at all, he is always sleepy.     Constitutional:  BP 116/74 (BP Location: Left Arm, Cuff Size: Normal)   Pulse 74   Ht 5\' 10"  (1.778 m)   Wt 215 lb (97.5 kg)   SpO2 98%   BMI 30.85 kg/m   Past Medical History:  Anxiety, PUD, HLD, HTN  Brief Summary:  Rodney Bruce is a 42 y.o. male with insomnia.  He quit smoking cigarettes about 6 to 8 months ago.  His sleep problems started after that.  He has been feeling very anxious.  This causes him trouble falling asleep.  Once he is asleep, he can usually stay asleep.  He has been feeling more stress related to work and home life.  He is also feeling depressed about his difficulties with sleep and how he feels this is impacted his quality of lift.  He goes to bed between 8 and 10 pm.  He doesn't feel sleepy until about 1030.  After he falls asleep he has to wake up sometimes to use the bathroom.  He gets out of bed at 7 am.  He feels tired in the morning.  He snores, but isn't sure he stops breathing while asleep.  No problem sleeping on his back that he is aware of.  He was using [place].  This helped some, but he was told he should be on this too long.  He went to the ER and to [facility] behavioral health clinic.  He has been prescribed prozac, melatonin, trazodone, buspar and atarax.  He was also told he might have restless leg syndrome.  He does get funny leg feelings at night.  He was prescribed gabapentin, but has only taken two doses so far.  His mother has history of sleep apnea.  He has history of hypertension and is on several medications for this.   Physical Exam:   Appearance - well kempt  ENMT - clear nasal mucosa, midline nasal septum, no oral exudates, no LAN, trachea midline, MP 4,  enlarged tongue, low laying soft palate Respiratory - normal chest wall, normal respiratory effort, no accessory muscle use, no wheeze/rales CV - s1s2 regular rate and rhythm, no murmurs, no peripheral edema, radial pulses symmetric GI - soft, non tender, no masses Lymph - no adenopathy noted in neck and axillary areas MSK - normal muscle strength and tone, normal gait Ext - no cyanosis, clubbing, or joint inflammation noted Skin - no rashes, lesions, or ulcers Neuro - oriented to person, place, and time Psych - normal mood and affect  CBC Latest Ref Rng & Units 05/05/2018 01/21/2017 09/17/2015  WBC 4.0 - 10.5 K/uL 11.7(H) 11.6(H) 10.9(H)  Hemoglobin 13.0 - 17.0 g/dL 16.1 09.6 04.5  Hematocrit 39.0 - 52.0 % 40.0 47.2 47.9  Platelets 150.0 - 400.0 K/uL 329.0 304.0 312.0    CMP Latest Ref Rng & Units 04/11/2018 05/19/2017 01/21/2017  Glucose 70 - 99 mg/dL 409(W) 119(J) 478(G)  BUN 6 - 23 mg/dL 16 14 14   Creatinine 0.40 - 1.50 mg/dL 9.56 2.13 0.86  Sodium 135 - 145 mEq/L 138 136 139  Potassium 3.5 - 5.1 mEq/L 4.5 4.3 3.7  Chloride 96 - 112 mEq/L 101 102 105  CO2 19 - 32 mEq/L  32 28 28  Calcium 8.4 - 10.5 mg/dL 16.1 9.7 9.9  Total Protein 6.0 - 8.3 g/dL 7.5 7.5 7.5  Total Bilirubin 0.2 - 1.2 mg/dL 0.7 0.8 0.5  Alkaline Phos 39 - 117 U/L 68 56 71  AST 0 - 37 U/L 12 16 16   ALT 0 - 53 U/L 17 16 21      Discussion:  He has symptoms of anxiety and depression.  This is associated with sleep onset insomnia.  He might also have a component of delayed sleep phase.  He reports symptoms of restless leg syndrome.  He also snores and is on several antihypertensive medications.  He has a family history of obstructive sleep apnea.  Assessment/Plan:   Sleep onset insomnia associated with anxiety and depression. - will have him try mirtazapine 30 mg nightly before bedtime - discussed sleep restriction, stimulus control, and relaxation techniques  Possible delayed sleep phase. - advised him to only go  to bed when he feels sleepy - he should maintain a regular sleep/wake schedule - avoid bright light sources too close to bedtime  Restless leg syndrome. - will check ferritin level - advised him to hold off on using gabapentin for now, but might need to resume this at some point  Snoring with refractory hypertension. - will likely need to arrange sleep study at some point - he doesn't feel like he is ready to do a sleep study now, and wants to work on insomnia aspect first - explained how symptoms of insomnia could actually be related to sleep apnea    Patient Instructions  Mirtazapine (remeron) 30 mg pill nightly - take 30 minutes before going to sleep  Blood test today  Follow up in 2 to 3 weeks    Coralyn Helling, MD [facility] Pager: [phone] 05/31/2018, 5:03 PM  Flow Sheet    Sleep tests:   Review of Systems:  Constitutional: Negative for fever and unexpected weight change.  HENT: Positive for congestion, postnasal drip, sinus pressure, sneezing and sore throat. Negative for dental problem, ear pain, nosebleeds, rhinorrhea and trouble swallowing.   Eyes: Negative for redness and itching.  Respiratory: Positive for shortness of breath. Negative for cough, chest tightness and wheezing.   Cardiovascular: Negative for palpitations and leg swelling.  Gastrointestinal: Negative for nausea and vomiting.  Genitourinary: Negative for dysuria.  Musculoskeletal: Negative for joint swelling.  Skin: Negative for rash.  Allergic/Immunologic: Positive for environmental allergies. Negative for food allergies and immunocompromised state.  Neurological: Negative for headaches.  Hematological: Does not bruise/bleed easily.  Psychiatric/Behavioral: Negative for dysphoric mood. The patient is nervous/anxious.    Medications:   Allergies as of 05/31/2018   No Known Allergies     Medication List        Accurate as of 05/31/18  5:03 PM. Always use your most  recent med list.          amLODipine 10 MG tablet Commonly known as:  NORVASC TAKE 1 TABLET(10 MG) BY MOUTH DAILY   atorvastatin 20 MG tablet Commonly known as:  LIPITOR Take 1 tablet (20 mg total) by mouth daily.   chlorthalidone 25 MG tablet Commonly known as:  HYGROTON Take 1 tablet (25 mg total) by mouth daily.   cloNIDine 0.1 MG tablet Commonly known as:  CATAPRES Take 1 tablet (0.1 mg total) by mouth 2 (two) times daily.   hydrochlorothiazide 25 MG tablet Commonly known as:  HYDRODIURIL TAKE 1 TABLET(25 MG) BY MOUTH DAILY   levocetirizine 5  MG tablet Commonly known as:  XYZAL Take 1 tablet (5 mg total) by mouth every evening.   losartan 100 MG tablet Commonly known as:  COZAAR TAKE 1 TABLET(100 MG) BY MOUTH DAILY   metoprolol succinate 100 MG 24 hr tablet Commonly known as:  TOPROL-XL Take 1 tablet (100 mg total) by mouth daily. Take with or immediately following a meal.   mirtazapine 30 MG tablet Commonly known as:  REMERON Take 1 tablet (30 mg total) by mouth at bedtime.   sulfamethoxazole-trimethoprim 800-160 MG tablet Commonly known as:  BACTRIM DS,SEPTRA DS Take 1 tablet by mouth 2 (two) times daily.       Past Surgical History:  He  has a past surgical history that includes Tonsillectomy (age 58).  Family History:  His family history includes Hyperlipidemia in his mother; Hypertension in his mother.  Social History:  He  reports that he quit smoking about 6 months ago. His smoking use included cigarettes. He has a 20.00 pack-year smoking history. He has never used smokeless tobacco. He reports that he does not drink alcohol or use drugs.

## 2018-05-31 NOTE — Patient Instructions (Signed)
Mirtazapine (remeron) 30 mg pill nightly - take 30 minutes before going to sleep  Blood test today  Follow up in 2 to 3 weeks

## 2018-05-31 NOTE — Progress Notes (Signed)
   Subjective:    Patient ID: Rodney Bruce, male    DOB: 04-20-1976, 42 y.o.   MRN: [id number]  HPI     Review of Systems  Constitutional: Negative for fever and unexpected weight change.  HENT: Positive for congestion, postnasal drip, sinus pressure, sneezing and sore throat. Negative for dental problem, ear pain, nosebleeds, rhinorrhea and trouble swallowing.   Eyes: Negative for redness and itching.  Respiratory: Positive for shortness of breath. Negative for cough, chest tightness and wheezing.   Cardiovascular: Negative for palpitations and leg swelling.  Gastrointestinal: Negative for nausea and vomiting.  Genitourinary: Negative for dysuria.  Musculoskeletal: Negative for joint swelling.  Skin: Negative for rash.  Allergic/Immunologic: Positive for environmental allergies. Negative for food allergies and immunocompromised state.  Neurological: Negative for headaches.  Hematological: Does not bruise/bleed easily.  Psychiatric/Behavioral: Negative for dysphoric mood. The patient is nervous/anxious.        Objective:   Physical Exam        Assessment & Plan:

## 2018-06-01 ENCOUNTER — Other Ambulatory Visit: Payer: Self-pay | Admitting: Medical

## 2018-06-01 ENCOUNTER — Other Ambulatory Visit (INDEPENDENT_AMBULATORY_CARE_PROVIDER_SITE_OTHER): Payer: BLUE CROSS/BLUE SHIELD

## 2018-06-01 DIAGNOSIS — D649 Anemia, unspecified: Secondary | ICD-10-CM | POA: Diagnosis not present

## 2018-06-01 LAB — IBC PANEL
Iron: 99 ug/dL (ref 42–165)
Saturation Ratios: 28.1 % (ref 20.0–50.0)
Transferrin: 252 mg/dL (ref 212.0–360.0)

## 2018-06-01 LAB — FERRITIN: Ferritin: 219.8 ng/mL (ref 22.0–322.0)

## 2018-06-01 NOTE — Addendum Note (Signed)
Addended by: Harley Alto on: 06/01/2018 09:17 AM   Modules accepted: Orders

## 2018-06-02 ENCOUNTER — Telehealth: Payer: Self-pay | Admitting: Pulmonary Disease

## 2018-06-02 NOTE — Telephone Encounter (Signed)
Iron/TIBC/Ferritin/ %Sat    Component Value Date/Time   IRON 99 06/01/2018 1136   FERRITIN 219.8 06/01/2018 1136   IRONPCTSAT 28.1 06/01/2018 1136    Please let him know his lab tests are normal.

## 2018-06-02 NOTE — Telephone Encounter (Signed)
Attempted to call patient today regarding results. I did not receive an answer at time of call. I have left a voicemail message for pt to return call. X1  

## 2018-06-06 NOTE — Telephone Encounter (Signed)
Called patient and advised of lab results per notation below.

## 2018-06-15 ENCOUNTER — Encounter: Payer: Self-pay | Admitting: Pulmonary Disease

## 2018-06-15 ENCOUNTER — Ambulatory Visit: Payer: BLUE CROSS/BLUE SHIELD | Admitting: Pulmonary Disease

## 2018-06-15 VITALS — BP 169/94 | HR 85 | Ht 70.0 in | Wt 220.0 lb

## 2018-06-15 DIAGNOSIS — G2581 Restless legs syndrome: Secondary | ICD-10-CM | POA: Diagnosis not present

## 2018-06-15 DIAGNOSIS — F5104 Psychophysiologic insomnia: Secondary | ICD-10-CM | POA: Diagnosis not present

## 2018-06-15 DIAGNOSIS — R0683 Snoring: Secondary | ICD-10-CM

## 2018-06-15 NOTE — Progress Notes (Signed)
[facility]  Chief Complaint  Patient presents with  . Follow-up    2 week f/u per patient. States he has been feeling ok since his last visit.     Constitutional:  BP (!) 169/94 (BP Location: Left Arm, Patient Position: Sitting, Cuff Size: Normal)   Pulse 85   Ht 5\' 10"  (1.778 m)   Wt 220 lb (99.8 kg)   SpO2 100%   BMI 31.57 kg/m   Past Medical History:  Anxiety, PUD, HLD, HTN  Brief Summary:  Rodney Bruce is a 42 y.o. male with insomnia.  He feels that he is doing much better.  Not feeling as anxious or depressed.  Sleep quality and duration improved.  Goes to bed at 10 to 11 pm.  Falls asleep quickly.  Wakes up at 7 am.  Feels refreshed.  No hang over effect from remeron.  Blood pressure still up.  Not having leg symptoms like before.  Didn't take gabapentin.  Physical Exam:   Appearance - well kempt ENMT - no sinus tenderness, no oral exudate, no LAN, MP 4, enlarged tongue, low laying soft palate Respiratory - no wheeze/rales CV - s1s2 regular, no murmur GI - soft, non tender Lymph - no LAN in neck or axillary regiion MSK - normal gait Ext - no edema, cyanosis, or clubbing Skin - no skin rashes Neuro - normal strength Psych - normal mood and affect   Discussion:  His insomnia, anxiety and depression have all improved with addition of remeron.  Not having significant leg symptoms at present, and don't think RLS is a significant issue at this time.  He has elevated blood pressure.  Has family history of OSA.  He does snore some.  Assessment/Plan:   Sleep onset insomnia associated with anxiety and depression. - continue mirtazapine 30 mg nightly - discussed how mirtazapine can cause weight gain  Snoring with refractory hypertension. - discussed how untreated sleep apnea can impact blood pressure control - will arrange for home sleep study  Restless leg syndrome. - monitor clinically  Allergic rhinitis. - he  will call if he needs help getting a referral to an allergist   Patient Instructions  Call if you need help getting a referral to an allergist  Will arrange for home sleep study  Follow up in 4 months    Coralyn Helling, MD [facility] Pager: [phone] 06/15/2018, 11:01 AM  Flow Sheet    Sleep tests:    Medications:   Allergies as of 06/15/2018   No Known Allergies     Medication List        Accurate as of 06/15/18 11:01 AM. Always use your most recent med list.          amLODipine 10 MG tablet Commonly known as:  NORVASC TAKE 1 TABLET(10 MG) BY MOUTH DAILY   atorvastatin 20 MG tablet Commonly known as:  LIPITOR Take 1 tablet (20 mg total) by mouth daily.   chlorthalidone 25 MG tablet Commonly known as:  HYGROTON Take 1 tablet (25 mg total) by mouth daily.   cloNIDine 0.1 MG tablet Commonly known as:  CATAPRES Take 1 tablet (0.1 mg total) by mouth 2 (two) times daily.   hydrochlorothiazide 25 MG tablet Commonly known as:  HYDRODIURIL TAKE 1 TABLET(25 MG) BY MOUTH DAILY   levocetirizine 5 MG tablet Commonly known as:  XYZAL Take 1 tablet (5 mg total) by mouth every evening.   losartan 100 MG tablet Commonly  known as:  COZAAR TAKE 1 TABLET(100 MG) BY MOUTH DAILY   metoprolol succinate 100 MG 24 hr tablet Commonly known as:  TOPROL-XL Take 1 tablet (100 mg total) by mouth daily. Take with or immediately following a meal.   mirtazapine 30 MG tablet Commonly known as:  REMERON Take 1 tablet (30 mg total) by mouth at bedtime.       Past Surgical History:  He  has a past surgical history that includes Tonsillectomy (age 51).  Family History:  His family history includes Hyperlipidemia in his mother; Hypertension in his mother.  Social History:  He  reports that he quit smoking about 6 months ago. His smoking use included cigarettes. He has a 20.00 pack-year smoking history. He has never used smokeless tobacco. He reports  that he does not drink alcohol or use drugs.

## 2018-06-15 NOTE — Patient Instructions (Signed)
Call if you need help getting a referral to an allergist  Will arrange for home sleep study  Follow up in 4 months

## 2018-06-29 DIAGNOSIS — G4733 Obstructive sleep apnea (adult) (pediatric): Secondary | ICD-10-CM | POA: Diagnosis not present

## 2018-06-30 ENCOUNTER — Other Ambulatory Visit: Payer: Self-pay | Admitting: *Deleted

## 2018-06-30 DIAGNOSIS — R0683 Snoring: Secondary | ICD-10-CM

## 2018-07-05 ENCOUNTER — Telehealth: Payer: Self-pay | Admitting: Pulmonary Disease

## 2018-07-05 ENCOUNTER — Encounter: Payer: Self-pay | Admitting: Pulmonary Disease

## 2018-07-05 DIAGNOSIS — G4733 Obstructive sleep apnea (adult) (pediatric): Secondary | ICD-10-CM

## 2018-07-05 HISTORY — DX: Obstructive sleep apnea (adult) (pediatric): G47.33

## 2018-07-05 NOTE — Telephone Encounter (Signed)
HST 06/29/18 >> AHI 7.4, SpO2 low 88%   Please let him know that the sleep study showed mild obstructive sleep apnea.  Please arrange for ROV with a nurse practitioner to review treatment options.

## 2018-07-10 NOTE — Telephone Encounter (Signed)
Called and spoke with patient regarding results.  Informed the patient of results and recommendations today. Scheduled appt with TN 07/11/18 at 9am Pt verbalized understanding and denied any questions or concerns at this time.  Nothing further needed.

## 2018-07-11 ENCOUNTER — Encounter: Payer: Self-pay | Admitting: Pulmonary Disease

## 2018-07-11 ENCOUNTER — Ambulatory Visit: Payer: BLUE CROSS/BLUE SHIELD | Admitting: Primary Care

## 2018-07-11 ENCOUNTER — Telehealth: Payer: Self-pay | Admitting: Primary Care

## 2018-07-11 DIAGNOSIS — G4733 Obstructive sleep apnea (adult) (pediatric): Secondary | ICD-10-CM | POA: Diagnosis not present

## 2018-07-11 NOTE — Assessment & Plan Note (Addendum)
BP elevated today, patient states he has degree of whitecoat syndrome Re-check blood pressure at end of visit today 164/88 Encourage patient to follow-up with primary care

## 2018-07-11 NOTE — Progress Notes (Signed)
 @Patient  ID: Rodney Bruce, male    DOB: 1976/07/01, 42 y.o.   MRN: [id number]  Chief Complaint  Patient presents with  . Follow-up    Discuss options for OSA    Referring provider: Marisue Bruce, Edward, PA-C  HPI: 42 year old male, former smoker quit in April 2019 (20 pack year hx). PMH OSA. Patient of Rodney Bruce, seen for initial consult on 05/31/18.  HST showed AHI 7.4, Sp O2 low 88%.   07/11/2018 Patient presents today for follow-up visit to review sleep test results.  Home sleep test showed mild sleep apnea.  Patient states that he wakes up in the middle of night with anxiety and racing heart rate.  Anxiety has improved somewhat since he stopped smoking 7 months ago. Breathing symptoms are better. Takes Remeron at bedtime. Discussed OSA options including weight loss, dental appliance, CPAP and inspire device. Patient states that he does not like wearing things around this neck such as jewelry. He would like to work on weight loss and consider dental appliance.   No Known Allergies  Immunization History  Administered Date(s) Administered  . Tdap 09/17/2015    Past Medical History:  Diagnosis Date  . Anxiety   . Bleeding ulcer   . Hyperlipidemia   . Hypertension   . OSA (obstructive sleep apnea) 07/05/2018    Tobacco History: Social History   Tobacco Use  Smoking Status Former Smoker  . Packs/day: 1.00  . Years: 20.00  . Pack years: 20.00  . Types: Cigarettes  . Last attempt to quit: 11/23/2017  . Years since quitting: 0.6  Smokeless Tobacco Never Used   Counseling given: Not Answered   Outpatient Medications Prior to Visit  Medication Sig Dispense Refill  . amLODipine (NORVASC) 10 MG tablet TAKE 1 TABLET(10 MG) BY MOUTH DAILY 90 tablet 0  . atorvastatin (LIPITOR) 20 MG tablet Take 1 tablet (20 mg total) by mouth daily. 90 tablet 1  . chlorthalidone (HYGROTON) 25 MG tablet Take 1 tablet (25 mg total) by mouth daily. 30 tablet 3  . cloNIDine (CATAPRES) 0.1 MG  tablet Take 1 tablet (0.1 mg total) by mouth 2 (two) times daily. 60 tablet 5  . hydrochlorothiazide (HYDRODIURIL) 25 MG tablet TAKE 1 TABLET(25 MG) BY MOUTH DAILY 90 tablet 0  . levocetirizine (XYZAL) 5 MG tablet Take 1 tablet (5 mg total) by mouth every evening. 30 tablet 0  . losartan (COZAAR) 100 MG tablet TAKE 1 TABLET(100 MG) BY MOUTH DAILY 90 tablet 0  . metoprolol succinate (TOPROL-XL) 100 MG 24 hr tablet Take 1 tablet (100 mg total) by mouth daily. Take with or immediately following a meal. 30 tablet 3  . mirtazapine (REMERON) 30 MG tablet Take 1 tablet (30 mg total) by mouth at bedtime. 30 tablet 1   No facility-administered medications prior to visit.     Review of Systems  Review of Systems  Constitutional: Negative.   HENT: Negative.   Respiratory: Positive for apnea. Negative for cough, choking, chest tightness, wheezing and stridor.   Cardiovascular: Negative.   Psychiatric/Behavioral: Positive for sleep disturbance. The patient is nervous/anxious.     Physical Exam  BP (!) 164/88 (BP Location: Left Arm, Patient Position: Sitting, Cuff Size: Normal)   Pulse 92   Ht 5\' 10"  (1.778 m)   Wt 232 lb 3.2 oz (105.3 kg)   SpO2 98%   BMI 33.32 kg/m  Physical Exam  Constitutional: He is oriented to person, place, and time. He appears well-developed and well-nourished.  HENT:  Head: Normocephalic and atraumatic.  Mallampati class I-II Tonsils resected  Eyes: Pupils are equal, round, and reactive to light. EOM are normal.  Neck: Normal range of motion. Neck supple.  Cardiovascular: Normal rate, regular rhythm, normal heart sounds and intact distal pulses.  Pulmonary/Chest: Effort normal and breath sounds normal. No respiratory distress. He has no wheezes.  Abdominal: There is no tenderness.  Musculoskeletal: Normal range of motion.  Neurological: He is alert and oriented to person, place, and time.  Skin: Skin is warm and dry. No rash noted. No erythema.  Psychiatric: He  has a normal mood and affect. His behavior is normal. Judgment normal.     Lab Results:  CBC    Component Value Date/Time   WBC 11.7 (H) 05/05/2018 1431   RBC 4.83 05/05/2018 1431   HGB 13.2 05/05/2018 1431   HCT 40.0 05/05/2018 1431   PLT 329.0 05/05/2018 1431   MCV 82.8 05/05/2018 1431   MCHC 33.0 05/05/2018 1431   RDW 13.3 05/05/2018 1431   LYMPHSABS 3.0 05/05/2018 1431   MONOABS 1.1 (H) 05/05/2018 1431   EOSABS 0.0 05/05/2018 1431   BASOSABS 0.1 05/05/2018 1431    BMET    Component Value Date/Time   NA 138 04/11/2018 1138   K 4.5 04/11/2018 1138   CL 101 04/11/2018 1138   CO2 32 04/11/2018 1138   GLUCOSE 113 (H) 04/11/2018 1138   BUN 16 04/11/2018 1138   CREATININE 1.19 04/11/2018 1138   CALCIUM 10.4 04/11/2018 1138    BNP No results found for: BNP  ProBNP No results found for: PROBNP  Imaging: No results found.   Assessment & Plan:   OSA (obstructive sleep apnea) Mild OSA on home sleep test 06/29/18; AHI 7.4 and SPO2 low 88% Patient would like to consider weight loss and dental appliance (given patient Dr. Myrtis Bruce and Dr. Alveda Bruce name and number) Encouraged follow-up in 3 months with Dr. Craige Cotta or NP   HTN (hypertension) BP elevated today, patient states he has degree of whitecoat syndrome Re-check blood pressure at end of visit today 164/88 Encourage patient to follow-up with primary care   Rodney Bayley, NP 07/11/2018

## 2018-07-11 NOTE — Telephone Encounter (Signed)
Home sleep study has not been scanned into chart yet will fax once report is avavailabe Tobe SosSally E Ottinger

## 2018-07-11 NOTE — Progress Notes (Deleted)
@Patient  ID: Rodney Bruce, male    DOB: [birth year], 42 y.o.   MRN: [id number]  No chief complaint on file.   Referring provider: Esperanza Richters, PA-C  HPI:   PMH:  Smoker/ Smoking History: Former Smoker.  Maintenance:   Pt of: Dr. Craige Cotta   07/11/2018  - Visit   HPI  Tests:  HST 06/29/18 >> AHI 7.4, SpO2 low 88%  FENO:  No results found for: NITRICOXIDE  PFT: No flowsheet data found.  Imaging: No results found.  Chart Review:    Specialty Problems      Pulmonary Problems   OSA (obstructive sleep apnea)      No Known Allergies  Immunization History  Administered Date(s) Administered  . Tdap 09/17/2015    Past Medical History:  Diagnosis Date  . Anxiety   . Bleeding ulcer   . Hyperlipidemia   . Hypertension   . OSA (obstructive sleep apnea) 07/05/2018    Tobacco History: Social History   Tobacco Use  Smoking Status Former Smoker  . Packs/day: 1.00  . Years: 20.00  . Pack years: 20.00  . Types: Cigarettes  . Last attempt to quit: 11/23/2017  . Years since quitting: 0.6  Smokeless Tobacco Never Used   Counseling given: Not Answered   Outpatient Encounter Medications as of 07/11/2018  Medication Sig  . amLODipine (NORVASC) 10 MG tablet TAKE 1 TABLET(10 MG) BY MOUTH DAILY  . atorvastatin (LIPITOR) 20 MG tablet Take 1 tablet (20 mg total) by mouth daily.  . chlorthalidone (HYGROTON) 25 MG tablet Take 1 tablet (25 mg total) by mouth daily.  . cloNIDine (CATAPRES) 0.1 MG tablet Take 1 tablet (0.1 mg total) by mouth 2 (two) times daily.  . hydrochlorothiazide (HYDRODIURIL) 25 MG tablet TAKE 1 TABLET(25 MG) BY MOUTH DAILY  . levocetirizine (XYZAL) 5 MG tablet Take 1 tablet (5 mg total) by mouth every evening.  Marland Kitchen losartan (COZAAR) 100 MG tablet TAKE 1 TABLET(100 MG) BY MOUTH DAILY  . metoprolol succinate (TOPROL-XL) 100 MG 24 hr tablet Take 1 tablet (100 mg total) by mouth daily. Take with or immediately following a meal.  . mirtazapine  (REMERON) 30 MG tablet Take 1 tablet (30 mg total) by mouth at bedtime.   No facility-administered encounter medications on file as of 07/11/2018.      Review of Systems  Review of Systems   Physical Exam  There were no vitals taken for this visit.  Wt Readings from Last 5 Encounters:  06/15/18 220 lb (99.8 kg)  05/31/18 215 lb (97.5 kg)  05/24/18 216 lb 12.8 oz (98.3 kg)  05/05/18 220 lb 3.2 oz (99.9 kg)  04/21/18 222 lb 3.2 oz (100.8 kg)     Physical Exam    Lab Results:  CBC    Component Value Date/Time   WBC 11.7 (H) 05/05/2018 1431   RBC 4.83 05/05/2018 1431   HGB 13.2 05/05/2018 1431   HCT 40.0 05/05/2018 1431   PLT 329.0 05/05/2018 1431   MCV 82.8 05/05/2018 1431   MCHC 33.0 05/05/2018 1431   RDW 13.3 05/05/2018 1431   LYMPHSABS 3.0 05/05/2018 1431   MONOABS 1.1 (H) 05/05/2018 1431   EOSABS 0.0 05/05/2018 1431   BASOSABS 0.1 05/05/2018 1431    BMET    Component Value Date/Time   NA 138 04/11/2018 1138   K 4.5 04/11/2018 1138   CL 101 04/11/2018 1138   CO2 32 04/11/2018 1138   GLUCOSE 113 (H) 04/11/2018 1138   BUN 16  04/11/2018 1138   CREATININE 1.19 04/11/2018 1138   CALCIUM 10.4 04/11/2018 1138    BNP No results found for: BNP  ProBNP No results found for: PROBNP    Assessment & Plan:     No problem-specific Assessment & Plan notes found for this encounter.     Coral CeoBrian P Delorean Knutzen, NP 07/11/2018

## 2018-07-11 NOTE — Patient Instructions (Addendum)
Refer patient to Dr. Toni Arthurs or Dr. [name] for dental appliance for OSA  Follow up in 3 months with Dr. Craige Cotta or NP   Follow-up with PCP regarding elevated Blood pressure     Sleep Apnea Sleep apnea is a condition in which breathing pauses or becomes shallow during sleep. Episodes of sleep apnea usually last 10 seconds or longer, and they may occur as many as 20 times an hour. Sleep apnea disrupts your sleep and keeps your body from getting the rest that it needs. This condition can increase your risk of certain health problems, including:  Heart attack.  Stroke.  Obesity.  Diabetes.  Heart failure.  Irregular heartbeat.  There are three kinds of sleep apnea:  Obstructive sleep apnea. This kind is caused by a blocked or collapsed airway.  Central sleep apnea. This kind happens when the part of the brain that controls breathing does not send the correct signals to the muscles that control breathing.  Mixed sleep apnea. This is a combination of obstructive and central sleep apnea.  What are the causes? The most common cause of this condition is a collapsed or blocked airway. An airway can collapse or become blocked if:  Your throat muscles are abnormally relaxed.  Your tongue and tonsils are larger than normal.  You are overweight.  Your airway is smaller than normal.  What increases the risk? This condition is more likely to develop in people who:  Are overweight.  Smoke.  Have a smaller than normal airway.  Are elderly.  Are male.  Drink alcohol.  Take sedatives or tranquilizers.  Have a family history of sleep apnea.  What are the signs or symptoms? Symptoms of this condition include:  Trouble staying asleep.  Daytime sleepiness and tiredness.  Irritability.  Loud snoring.  Morning headaches.  Trouble concentrating.  Forgetfulness.  Decreased interest in sex.  Unexplained sleepiness.  Mood swings.  Personality changes.  Feelings of  depression.  Waking up often during the night to urinate.  Dry mouth.  Sore throat.  How is this diagnosed? This condition may be diagnosed with:  A medical history.  A physical exam.  A series of tests that are done while you are sleeping (sleep study). These tests are usually done in a sleep lab, but they may also be done at home.  How is this treated? Treatment for this condition aims to restore normal breathing and to ease symptoms during sleep. It may involve managing health issues that can affect breathing, such as high blood pressure or obesity. Treatment may include:  Sleeping on your side.  Using a decongestant if you have nasal congestion.  Avoiding the use of depressants, including alcohol, sedatives, and narcotics.  Losing weight if you are overweight.  Making changes to your diet.  Quitting smoking.  Using a device to open your airway while you sleep, such as: ? An oral appliance. This is a custom-made mouthpiece that shifts your lower jaw forward. ? A continuous positive airway pressure (CPAP) device. This device delivers oxygen to your airway through a mask. ? A nasal expiratory positive airway pressure (EPAP) device. This device has valves that you put into each nostril. ? A bi-level positive airway pressure (BPAP) device. This device delivers oxygen to your airway through a mask.  Surgery if other treatments do not work. During surgery, excess tissue is removed to create a wider airway.  It is important to get treatment for sleep apnea. Without treatment, this condition can lead to:  High blood pressure.  Coronary artery disease.  (Men) An inability to achieve or maintain an erection (impotence).  Reduced thinking abilities.  Follow these instructions at home:  Make any lifestyle changes that your health care provider recommends.  Eat a healthy, well-balanced diet.  Take over-the-counter and prescription medicines only as told by your health  care provider.  Avoid using depressants, including alcohol, sedatives, and narcotics.  Take steps to lose weight if you are overweight.  If you were given a device to open your airway while you sleep, use it only as told by your health care provider.  Do not use any tobacco products, such as cigarettes, chewing tobacco, and e-cigarettes. If you need help quitting, ask your health care provider.  Keep all follow-up visits as told by your health care provider. This is important. Contact a health care provider if:  The device that you received to open your airway during sleep is uncomfortable or does not seem to be working.  Your symptoms do not improve.  Your symptoms get worse. Get help right away if:  You develop chest pain.  You develop shortness of breath.  You develop discomfort in your back, arms, or stomach.  You have trouble speaking.  You have weakness on one side of your body.  You have drooping in your face. These symptoms may represent a serious problem that is an emergency. Do not wait to see if the symptoms will go away. Get medical help right away. Call your local emergency services (911 in the [place] Do not drive yourself to the hospital. This information is not intended to replace advice given to you by your health care provider. Make sure you discuss any questions you have with your health care provider. Document Released: 07/30/2002 Document Revised: 04/04/2016 Document Reviewed: 05/19/2015 Elsevier Interactive Patient Education  [employer]2018 Elsevier Inc.

## 2018-07-11 NOTE — Assessment & Plan Note (Addendum)
Mild OSA on home sleep test 06/29/18; AHI 7.4 and SPO2 low 88% Patient would like to consider weight loss and dental appliance (given patient Dr. Myrtis SerKatz and Dr. Alveda ReasonsFuller's name and number) Encouraged follow-up in 3 months with [name] or NP

## 2018-07-11 NOTE — Progress Notes (Signed)
Reviewed and agree with assessment/plan.   Anu Stagner, MD [facility] 08/18/2016, 12:24 PM Pager:  [phone]  

## 2018-07-11 NOTE — Telephone Encounter (Signed)
Unable to print of HST study at this time due to the move and printer not being set up because of the move. I will [facility] to [facility] so they can help with once printer is ready.

## 2018-07-11 NOTE — Telephone Encounter (Signed)
Sleep Study faxed to Dr Ether GriffinsSandra Fuller's office.  Nothing further needed.

## 2018-07-13 ENCOUNTER — Ambulatory Visit: Payer: BLUE CROSS/BLUE SHIELD | Admitting: Allergy & Immunology

## 2018-07-13 ENCOUNTER — Encounter: Payer: Self-pay | Admitting: Allergy & Immunology

## 2018-07-13 VITALS — HR 79 | Temp 98.3°F | Resp 20 | Ht 69.29 in | Wt 232.0 lb

## 2018-07-13 DIAGNOSIS — T63481D Toxic effect of venom of other arthropod, accidental (unintentional), subsequent encounter: Secondary | ICD-10-CM | POA: Diagnosis not present

## 2018-07-13 DIAGNOSIS — J310 Chronic rhinitis: Secondary | ICD-10-CM

## 2018-07-13 MED ORDER — TRIAMCINOLONE ACETONIDE 55 MCG/ACT NA AERO: 2.0000 | INHALATION_SPRAY | Freq: Every day | NASAL | 5 refills | Status: AC

## 2018-07-13 MED ORDER — AZELASTINE HCL 0.1 % NA SOLN: 2.0000 | Freq: Two times a day (BID) | NASAL | 5 refills | Status: AC | PRN

## 2018-07-13 NOTE — Progress Notes (Signed)
NEW PATIENT  Date of Service/Encounter:  07/13/18  Referring provider: Esperanza Richters, PA-C   Assessment:   Chronic rhinitis  Insect sting allergy  Plan/Recommendations:   1. Chronic rhinitis - Testing today showed: negative to the entire panel, but we will get blood work to confirm - We will call you in 1-2 weeks with the results of the testing. - Non-allergic rhinitis can be caused by chemical exposures, fragrances, etc.  - Stop taking: Flonase - Start taking: Nasacort (triamcinolone) two sprays per nostril daily and Astelin (azelastine) 2 sprays per nostril 1-2 times daily as needed - You can use an extra dose of the antihistamine, if needed, for breakthrough symptoms.  - Consider nasal saline rinses 1-2 times daily to remove allergens from the nasal cavities as well as help with mucous clearance (this is especially helpful to do before the nasal sprays are given)  2. Insect sting allergy - We iwll look for evidence of stinging insect allergies in your blood. - We will call you in 1-2 weeks with the results of the testing. - We will defer EpiPen until we do the testing.   3. Return in about 3 months (around 10/13/2018).  Subjective:   Rodney Bruce is a 42 y.o. male presenting today for evaluation of  Chief Complaint  Patient presents with  . Allergic Rhinitis     Rodney Bruce has a history of the following: Patient Active Problem List   Diagnosis Date Noted  . OSA (obstructive sleep apnea) 07/05/2018  . HTN (hypertension) 09/17/2015  . Hyperlipidemia 09/17/2015  . Smoker 09/17/2015  . Wellness examination 09/17/2015    History obtained from: chart review and patient.  Rodney Bruce was referred by Esperanza Richters, PA-C.     Rodney Bruce is a 42 y.o. male presenting for an evaluation of chronic congestion. He stopped smoking in May 2019 and his congestion startd.  This is mostly on the right side more than the left. He has tried using Zyrtec  as well as some nasal sprays. He has been on Flonase. It does not tends to remain the same at home and work. It is intermittent though, becoming more clear. He has tried a Netti pot on one or two occasions. This did help somewhat. There are times whe nhe wakes up and feels like "there is nothing in there". He has never been on antibiotics for any sinus infections.  He never has any ocular symptoms.  He has never been allergy tested in the past.  He has never been to see otolaryngology.  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  He reports that he sat on a insect when he was in the [employer]	[place] and then developed hives over his entire body.  He was itching for several days.  He says he treated this with "popping Benadryl".  He has never been tested and does not have an EpiPen.  Rodney Bruce has no history of asthma. He says that he did have asthma as a child but he has not needed albuterol in quite some time.  He no longer has an inhaler at all.  He has no history of eczema or urticaria.  He does have a history of sleep apnea and sees Dr. Craige Cotta.  He is going to be starting an oral device rather than a CPAP because his sleep apnea was very mild.  He has never been diagnosed with COPD, although he has smoked from the age of 80 through age 31 (quit spring 2019 ).  He tolerates all the major food allergens without adverse event.  He served [place] 4 years in [place] [employer]  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 Otherwise, there is no history of other atopic diseases, including asthma, food allergies, drug allergies, eczema or urticaria. There is no significant infectious history. Vaccinations are up to date.    Past Medical History: Patient Active Problem List   Diagnosis Date Noted  . OSA (obstructive sleep apnea) 07/05/2018  . HTN (hypertension) 09/17/2015  . Hyperlipidemia 09/17/2015  . Smoker 09/17/2015  . Wellness examination 09/17/2015    Medication List:  Allergies as of  07/13/2018   No Known Allergies     Medication List        Accurate as of 07/13/18  1:26 PM. Always use your most recent med list.          amLODipine 10 MG tablet Commonly known as:  NORVASC TAKE 1 TABLET(10 MG) BY MOUTH DAILY   atorvastatin 20 MG tablet Commonly known as:  LIPITOR Take 1 tablet (20 mg total) by mouth daily.   azelastine 0.1 % nasal spray Commonly known as:  ASTELIN Place 2 sprays into both nostrils 2 (two) times daily as needed for rhinitis.   cloNIDine 0.1 MG tablet Commonly known as:  CATAPRES Take 1 tablet (0.1 mg total) by mouth 2 (two) times daily.   hydrochlorothiazide 25 MG tablet Commonly known as:  HYDRODIURIL TAKE 1 TABLET(25 MG) BY MOUTH DAILY   losartan 100 MG tablet Commonly known as:  COZAAR TAKE 1 TABLET(100 MG) BY MOUTH DAILY   metoprolol succinate 100 MG 24 hr tablet Commonly known as:  TOPROL-XL Take 1 tablet (100 mg total) by mouth daily. Take with or immediately following a meal.   mirtazapine 30 MG tablet Commonly known as:  REMERON Take 1 tablet (30 mg total) by mouth at bedtime.   triamcinolone 55 MCG/ACT Aero nasal inhaler Commonly known as:  NASACORT Place 2 sprays into the nose daily.       Birth History: non-contributory  Developmental History: non-contributory.   Past Surgical History: Past Surgical History:  Procedure Laterality Date  . TONSILLECTOMY  age [age]     Family History: Family History  Problem Relation Age of Onset  . Hypertension Mother   . Hyperlipidemia Mother      Social History: Rodney Bruce lives at home which is 42 years old.  There is wood in the main living areas and carpeting in the bedroom.  He has gas heating and central cooling.  There are no animals inside or outside of the home.  There are no dust mite covers on the bedding.  There is no tobacco exposure.  He smokes 23 Dec 2017 since the age of 42.     Review of Systems: a 14-point review of systems is pertinent for what is  mentioned in HPI.  Otherwise, all other systems were negative. Constitutional: negative other than that listed in the HPI Eyes: negative other than that listed in the HPI Ears, nose, mouth, throat, and face: negative other than that listed in the HPI Respiratory: negative other than that listed in the HPI Cardiovascular: negative other than that listed in the HPI Gastrointestinal: negative other than that listed in the HPI Genitourinary: negative other than that listed in the HPI Integument: negative other than that listed in the HPI Hematologic: negative other than that listed in the HPI Musculoskeletal: negative other than that listed in the HPI Neurological: negative other than that  listed in the HPI Allergy/Immunologic: negative other than that listed in the HPI    Objective:   Pulse 79, temperature 98.3 F (36.8 C), temperature source Oral, resp. rate 20, height 5' 9.29" (1.76 m), weight 232 lb (105.2 kg), SpO2 98 %. Body mass index is 33.97 kg/m.   Physical Exam:  General: Alert, interactive, in no acute distress. Pleasant male.  Eyes: No conjunctival injection bilaterally, no discharge on the right, no discharge on the left and no Horner-Trantas dots present. PERRL bilaterally. EOMI without pain. No photophobia.  Ears: Right TM pearly gray with normal light reflex, Left TM pearly gray with normal light reflex, Right TM intact without perforation and Left TM intact without perforation.  Nose/Throat: External nose within normal limits, nasal crease present and septum midline. Turbinates markedly edematous and pale with thick discharge. Posterior oropharynx erythematous without cobblestoning in the posterior oropharynx. Tonsils 2+ without exudates.  Tongue without thrush. Neck: Supple without thyromegaly. Trachea midline. Adenopathy: shoddy bilateral anterior cervical lymphadenopathy, no enlarged lymph nodes appreciated in the occipital, axillary, epitrochlear, inguinal, or  popliteal regions. and no enlarged lymph nodes appreciated in the anterior cervical, occipital, axillary, epitrochlear, inguinal, or popliteal regions. Lungs: Clear to auscultation without wheezing, rhonchi or rales. No increased work of breathing. CV: Normal S1/S2. No murmurs. Capillary refill <2 seconds.  Abdomen: Nondistended, nontender. No guarding or rebound tenderness. Bowel sounds present in all fields and hypoactive  Skin: Warm and dry, without lesions or rashes. Extremities:  No clubbing, cyanosis or edema. Neuro:   Grossly intact. No focal deficits appreciated. Responsive to questions.  Diagnostic studies:    Allergy Studies:   Airborne Adult Perc - Aug 04, 2018 1042    Time Antigen Placed  1042    Allergen Manufacturer  Waynette Buttery    Location  Back    Number of Test  59    Panel 1  Select    1. Control-Buffer 50% Glycerol  Negative    2. Control-Histamine 1 mg/ml  2+    3. Albumin saline  Negative    4. Bahia  Negative    5. French Southern Territories  Negative    6. Johnson  Negative    7. Kentucky Blue  Negative    8. Meadow Fescue  Negative    9. Perennial Rye  Negative    10. Sweet Vernal  Negative    11. Timothy  Negative    12. Cocklebur  Negative    13. Burweed Marshelder  Negative    14. Ragweed, short  Negative    15. Ragweed, Giant  Negative    16. Plantain,  English  Negative    17. Lamb's Quarters  Negative    18. Sheep Sorrell  Negative    19. Rough Pigweed  Negative    20. Marsh Elder, Rough  Negative    21. Mugwort, Common  Negative    22. Ash mix  Negative    23. Birch mix  Negative    24. Beech American  Negative    25. Box, Elder  Negative    26. Cedar, red  Negative    27. Cottonwood, Guinea-Bissau  Negative    28. Elm mix  Negative    29. Hickory mix  Negative    30. Maple mix  Negative    31. Oak, Guinea-Bissau mix  Negative    32. Pecan Pollen  Negative    33. Pine mix  Negative    34. Sycamore Eastern  Negative    35.  Walnut, Black Pollen  Negative    36. Alternaria  alternata  Negative    37. Cladosporium Herbarum  Negative    38. Aspergillus mix  Negative    39. Penicillium mix  Negative    40. Bipolaris sorokiniana (Helminthosporium)  Negative    41. Drechslera spicifera (Curvularia)  Negative    42. Mucor plumbeus  Negative    43. Fusarium moniliforme  Negative    44. Aureobasidium pullulans (pullulara)  Negative    45. Rhizopus oryzae  Negative    46. Botrytis cinera  Negative    47. Epicoccum nigrum  Negative    48. Phoma betae  Negative    49. Candida Albicans  Negative    50. Trichophyton mentagrophytes  Negative    51. Mite, D Farinae  5,000 AU/ml  Negative    52. Mite, D Pteronyssinus  5,000 AU/ml  Negative    53. Cat Hair 10,000 BAU/ml  Negative    54.  Dog Epithelia  Negative    55. Mixed Feathers  Negative    56. Horse Epithelia  Negative    57. Cockroach, German  Negative    58. Mouse  Negative    59. Tobacco Leaf  Negative        Allergy testing results were read and interpreted by myself, documented by clinical staff.       Malachi Bonds, MD [facility]

## 2018-07-13 NOTE — Patient Instructions (Addendum)
1. Chronic rhinitis - Testing today showed: negative to the entire panel, but we will get blood work to confirm - We will call you in 1-2 weeks with the results of the testing. - Non-allergic rhinitis can be caused by chemical exposures, fragrances, etc.  - Stop taking: Flonase - Start taking: Nasacort (triamcinolone) two sprays per nostril daily and Astelin (azelastine) 2 sprays per nostril 1-2 times daily as needed - You can use an extra dose of the antihistamine, if needed, for breakthrough symptoms.  - Consider nasal saline rinses 1-2 times daily to remove allergens from the nasal cavities as well as help with mucous clearance (this is especially helpful to do before the nasal sprays are given)  2. Insect sting allergy - We iwll look for evidence of stinging insect allergies in your blood. - We will call you in 1-2 weeks with the results of the testing. - We will defer EpiPen until we do the testing.   3. Return in about 3 months (around 10/13/2018).   Please inform us of any Emergency Department visits, hospitalizations, or changes in symptoms. Call us before going to the ED for breathing or allergy symptoms since we might be able to fit you in for a sick visit. Feel free to contact us anytime with any questions, problems, or concerns.  It was a pleasure to meet you today! Thank you for your service.  Websites that have reliable patient information: 1. American Academy of Asthma, Allergy, and Immunology: www.aaaai.org 2. Food Allergy Research and Education (FARE): foodallergy.org 3. Mothers of Asthmatics: http://www.asthmacommunitynetwork.org 4. American College of Allergy, Asthma, and Immunology: MissingWeapons.cawww.acaai.org   Make sure you are registered to vote! If you have moved or changed any of your contact information, you will need to get this updated before voting!

## 2018-07-17 LAB — IGE+ALLERGENS ZONE 2(30)
Bahia Grass IgE: <0.1 kU/L
Cockroach, American IgE: <0.1 kU/L
D Pteronyssinus IgE: <0.1 kU/L
Elm, American IgE: <0.1 kU/L
Hickory, White IgE: <0.1 kU/L
IgE (Immunoglobulin E), Serum: 92 IU/mL (ref 6–495)
Maple/Box Elder IgE: <0.1 kU/L
Nettle IgE: <0.1 kU/L
Oak, White IgE: <0.1 kU/L
Penicillium Chrysogen IgE: <0.1 kU/L
Plantain, English IgE: <0.1 kU/L
Sheep Sorrel IgE Qn: <0.1 kU/L

## 2018-07-17 LAB — ALLERGEN STINGING INSECT PANEL
Honeybee IgE: 0.15 kU/L — AB
Hornet, Yellow, IgE: 2.48 kU/L — AB
I002-IGE HORNET, WHITE FACE: 2.43 kU/L — AB
I003-IGE YELLOW JACKET: 3.12 kU/L — AB
I004-IGE PAPER WASP: 6.31 kU/L — AB

## 2018-07-25 ENCOUNTER — Other Ambulatory Visit: Payer: Self-pay | Admitting: Pulmonary Disease

## 2018-08-05 ENCOUNTER — Other Ambulatory Visit: Payer: Self-pay | Admitting: Medical

## 2018-08-06 ENCOUNTER — Other Ambulatory Visit: Payer: Self-pay | Admitting: Medical

## 2018-08-14 ENCOUNTER — Other Ambulatory Visit: Payer: Self-pay | Admitting: Medical

## 2018-08-23 ENCOUNTER — Other Ambulatory Visit: Payer: Self-pay | Admitting: Pulmonary Disease

## 2018-09-23 ENCOUNTER — Other Ambulatory Visit: Payer: Self-pay | Admitting: Medical

## 2018-10-24 ENCOUNTER — Other Ambulatory Visit: Payer: Self-pay | Admitting: Medical

## 2018-11-17 ENCOUNTER — Other Ambulatory Visit: Payer: Self-pay | Admitting: Medical

## 2019-01-19 ENCOUNTER — Other Ambulatory Visit: Payer: Self-pay

## 2019-01-19 ENCOUNTER — Ambulatory Visit (INDEPENDENT_AMBULATORY_CARE_PROVIDER_SITE_OTHER): Payer: BLUE CROSS/BLUE SHIELD | Admitting: Medical

## 2019-01-19 ENCOUNTER — Encounter: Payer: Self-pay | Admitting: Medical

## 2019-01-19 VITALS — BP 144/86 | Temp 98.6°F | Ht 69.0 in | Wt 244.0 lb

## 2019-01-19 DIAGNOSIS — K5900 Constipation, unspecified: Secondary | ICD-10-CM | POA: Diagnosis not present

## 2019-01-19 DIAGNOSIS — E669 Obesity, unspecified: Secondary | ICD-10-CM

## 2019-01-19 NOTE — Patient Instructions (Addendum)
I do thinks constipation related to poor diet and lack of exercise. Recommend cut back on eating out, eat more fruits/vegetables, less processed carbohydrates , drink more water daily and try to exercise/walk mile a day. I still some constipation then can use miralax or dulcolax every 2nd or 3rd day. Will see how you do on follow up. If not improved then may need office visit to feel abdomen and do DRE. If bowel movement not normalizing consider med for ibs-C or refer to GI. Continue with probiotic.  For increased weight bmi 36, I do think diet like weight watchers would be of benefit. I think this will help you loose weight and if you eat plenty of the zero point foods that you will also have better daily bm.  Follow up in 10 days or as needed

## 2019-01-19 NOTE — Progress Notes (Signed)
Subjective:    Patient ID: Rodney Bruce, male    DOB: Jun 02, 1976, 42 y.o.   MRN: [id number]  HPI  Virtual Visit via Video Note  I connected with Rodney Bruce on 01/19/19 at  9:30 AM EDT by a video enabled telemedicine application and verified that I am speaking with the correct person using two identifiers.  Location: Patient: home Provider: home   I discussed the limitations of evaluation and management by telemedicine and the availability of in person appointments. The patient expressed understanding and agreed to proceed.   History of Present Illness:  Pt states he has had some mild constipation recently. He states that he just does not feel empty at times. States has to have small harder, less frequent  bm over past week. He states small amount and hard to pass. Not large bm. Pt states he walks every now and then. Not daily. Maybe once a week. He admits not drinking a lot water. No chronic constipation. He ate apple last night and took metamucil good bm today. No pain meds used.  He does drink any coffee. He is eating out a lot and getting take out foods.  No nausea, no vomiting, and no fever.  In past he had bm every day in the morning. He is on probiotic.  Pt also gained a lot of weight recently since quite smoking.     Observations/Objective: General-no acute distress, pleasant, oriented. Lungs- on inspection lungs appear unlabored. Neck- no tracheal deviation or jvd on inspection. Neuro- gross motor function appears intact.  Assessment and Plan:  I do thinks constipation related to poor diet and lack of exercise. Recommend cut back on eating out, eat more fruits/vegetables, less processed carbohydrates , drink more water daily and try to exercise/walk mile a day. I still some constipation then can use miralax or dulcolax every 2nd or 3rd day. Will see how you do on follow up. If not improved then may need office visit to feel abdomen and do DRE. If bowel  movement not normalizing consider med for ibs-C or refer to GI. Continue with probiotic.  For increased weight bmi 36, I do think diet like weight watchers would be of benefit. I think this will help you loose weight and if you eat plenty of the zero point foods that you will also have better daily bm.  Follow up in 10 days or as needed Follow Up Instructions:    I discussed the assessment and treatment plan with the patient. The patient was provided an opportunity to ask questions and all were answered. The patient agreed with the plan and demonstrated an understanding of the instructions.   The patient was advised to call back or seek an in-person evaluation if the symptoms worsen or if the condition fails to improve as anticipated.  25 minutes spent with pt. 50% of time spent counseling on plan going forward and answering questions.   Esperanza Richters, PA-C   Review of Systems  Constitutional: Negative for chills, fatigue and unexpected weight change.  Respiratory: Negative for cough, chest tightness, shortness of breath and wheezing.   Cardiovascular: Negative for chest pain and palpitations.  Gastrointestinal: Positive for constipation. Negative for abdominal distention, anal bleeding, nausea, rectal pain and vomiting.  Musculoskeletal: Negative for gait problem.  Neurological: Negative for dizziness, speech difficulty, weakness and headaches.  Hematological: Negative for adenopathy. Does not bruise/bleed easily.  Psychiatric/Behavioral: Negative for behavioral problems and hallucinations.       Objective:   Physical  Exam        Assessment & Plan:

## 2019-01-21 ENCOUNTER — Other Ambulatory Visit: Payer: Self-pay | Admitting: Medical

## 2019-01-23 IMAGING — DX DG CHEST 2V
2 series · 2 of 2 positions shown · non-contrast
Comparison: None.

CLINICAL DATA: History of smoking short of breath

EXAM:
CHEST - 2 VIEW

[chest pa]
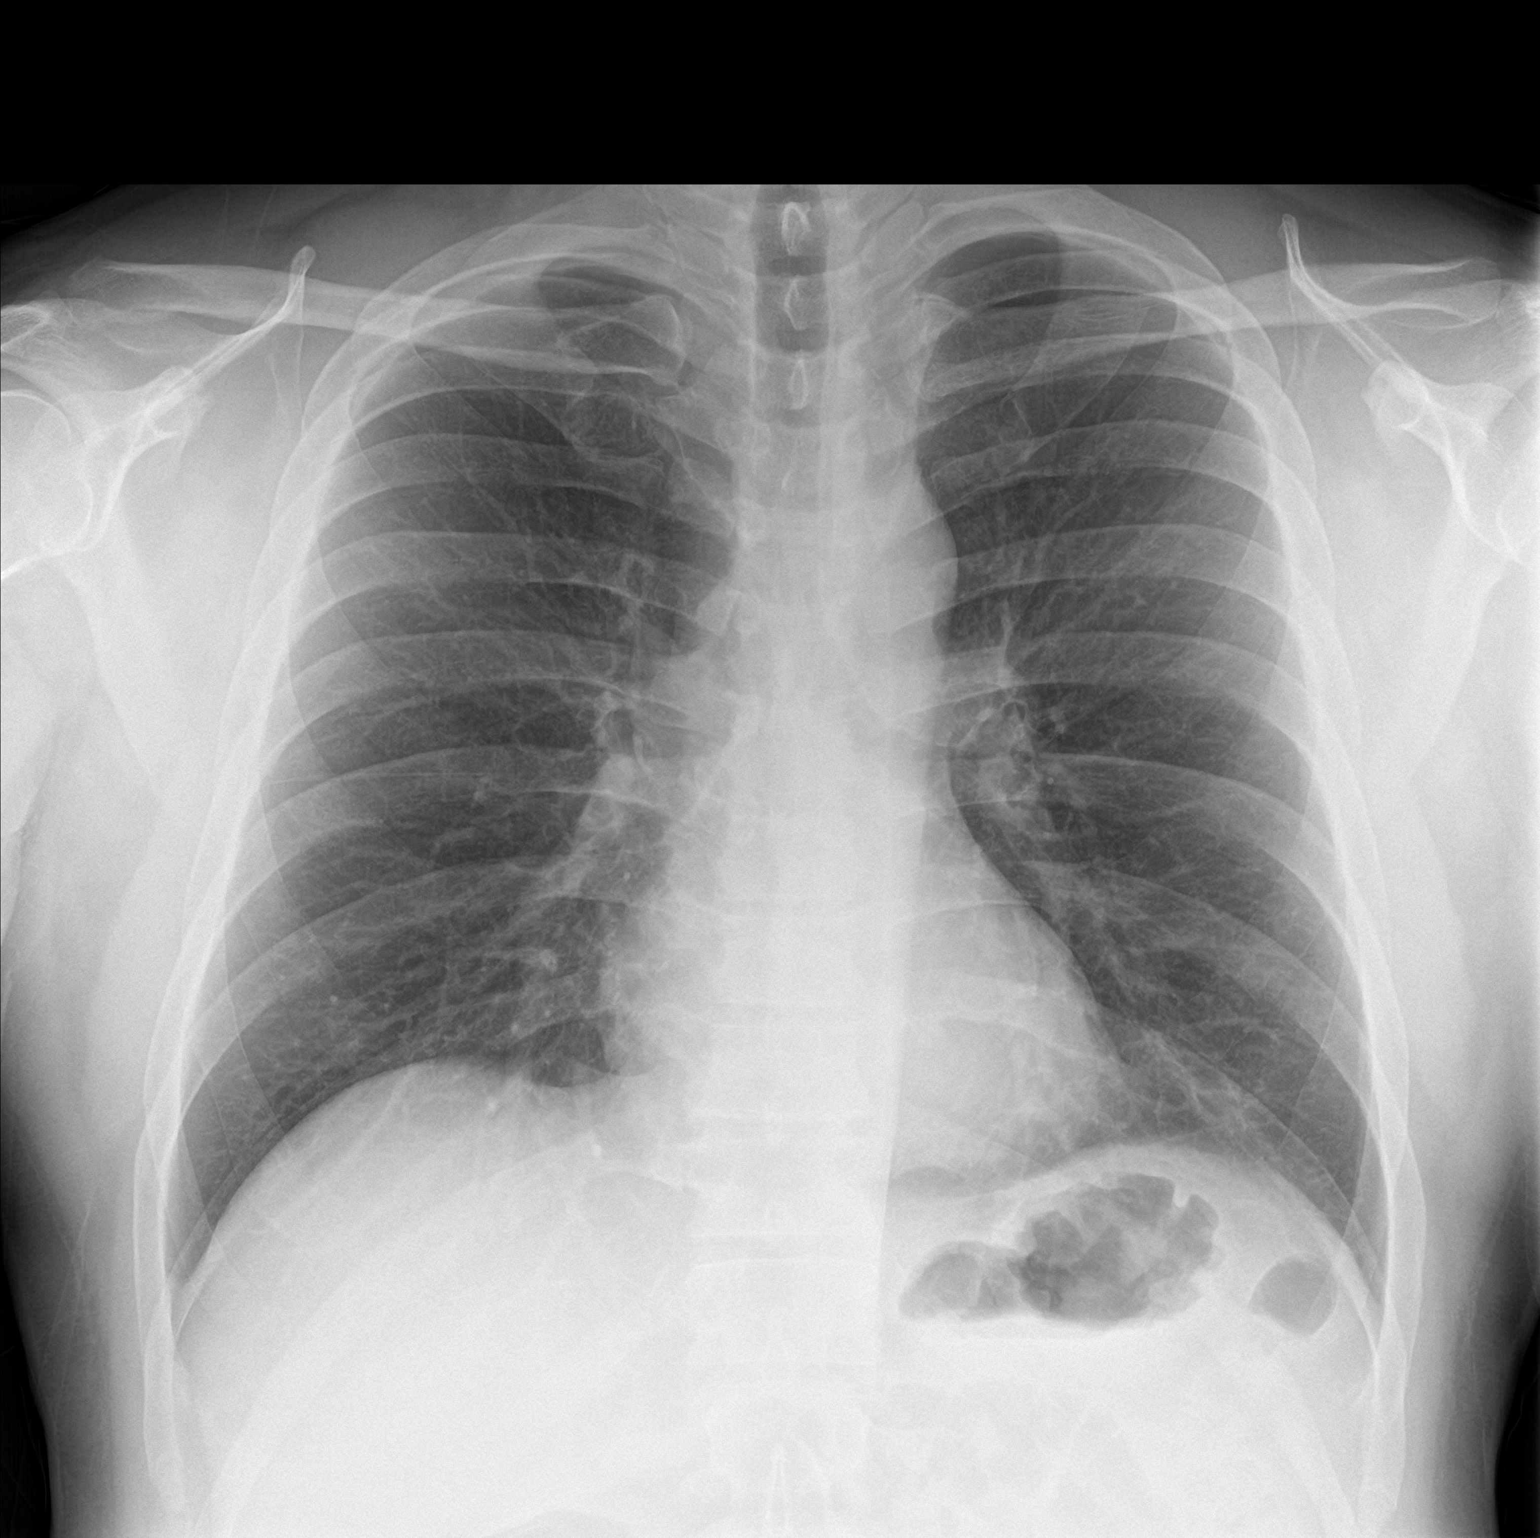

[chest lat]
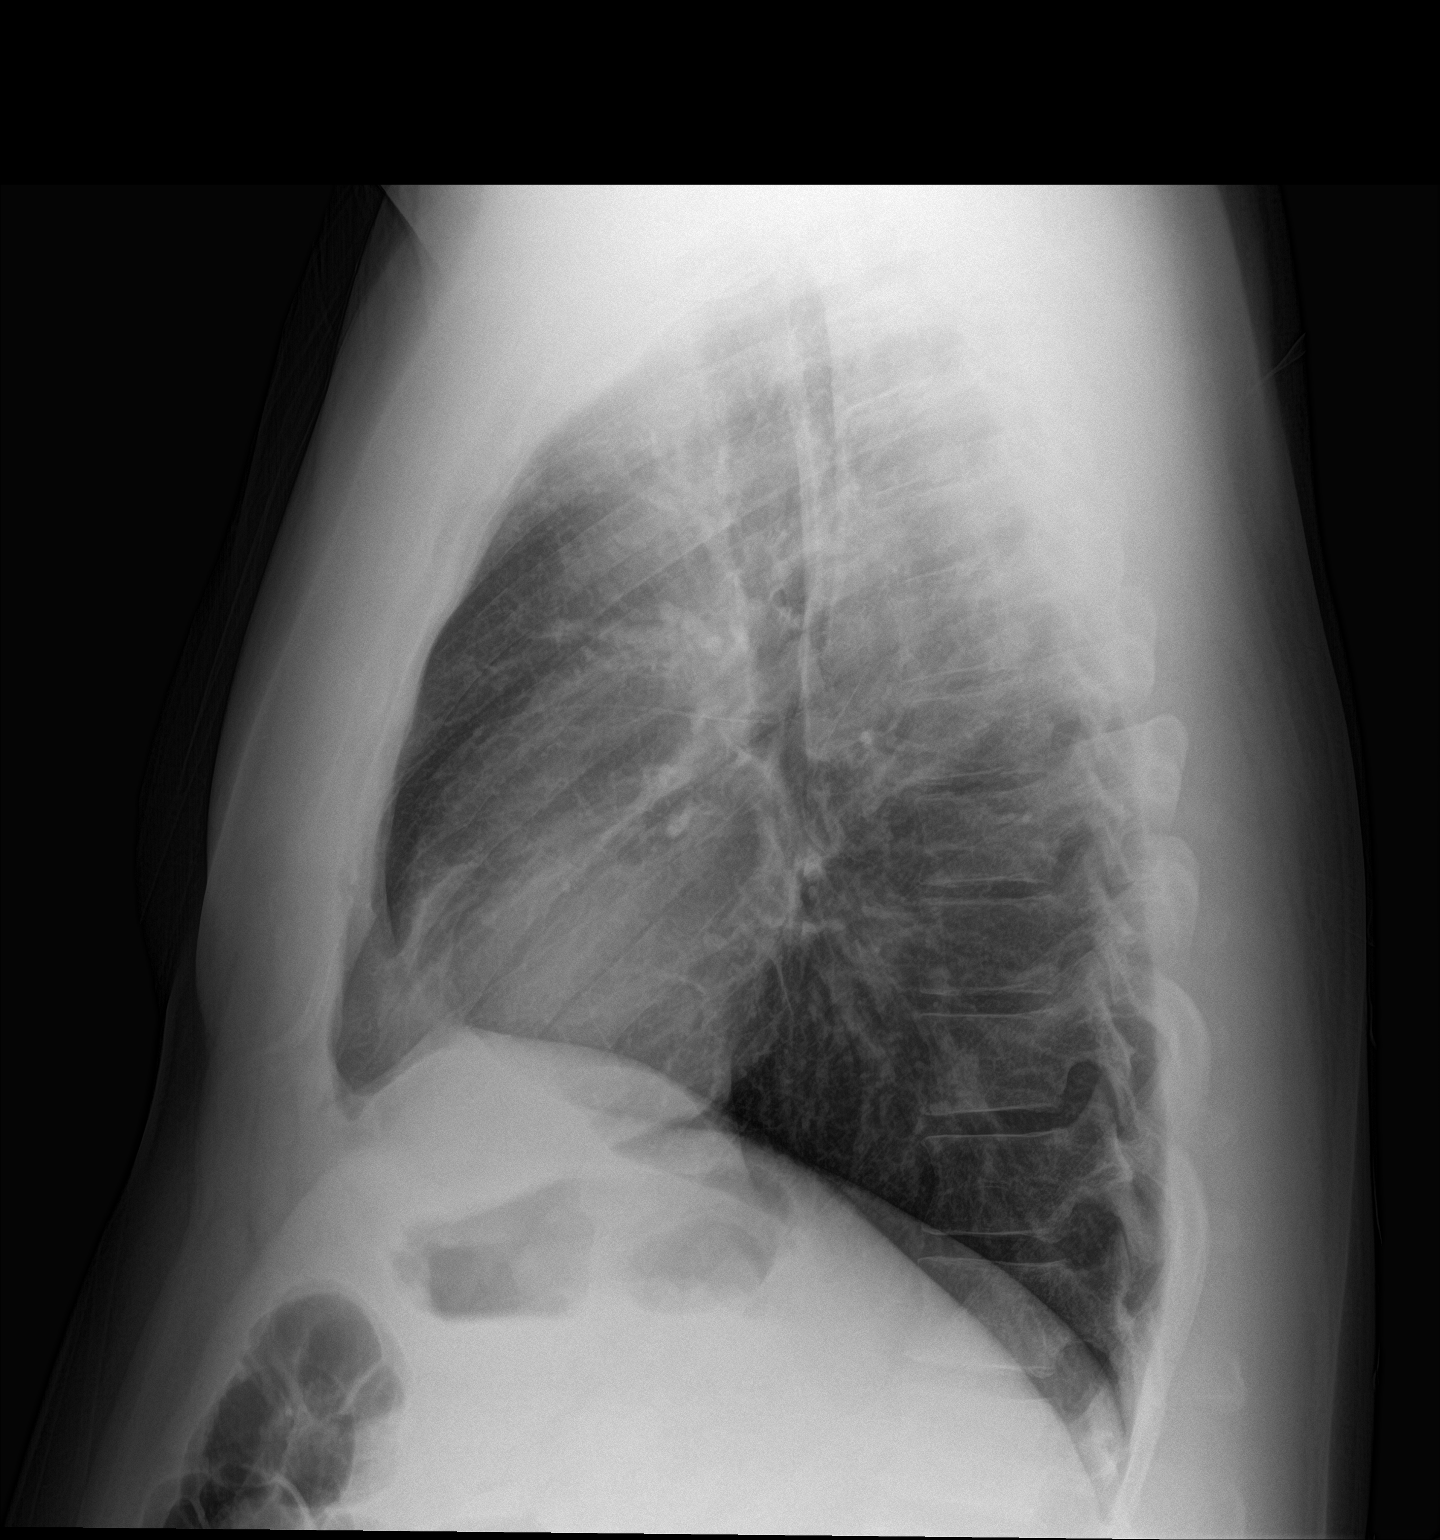

[2 of 2 positions shown; findings below may reference images not displayed]

FINDINGS: Streaky atelectasis or scar at the left base. No focal consolidation
or effusion. Normal heart size. No pneumothorax.
IMPRESSION: No active cardiopulmonary disease. Mild atelectasis or scarring at
the left lung base.

## 2019-02-20 ENCOUNTER — Other Ambulatory Visit: Payer: Self-pay

## 2019-02-21 ENCOUNTER — Ambulatory Visit (HOSPITAL_BASED_OUTPATIENT_CLINIC_OR_DEPARTMENT_OTHER)
Admission: RE | Admit: 2019-02-21 | Discharge: 2019-02-21 | Disposition: A | Discharge status: Home / Self Care | Payer: BC Managed Care – PPO | Source: Ambulatory Visit | Attending: Medical | Admitting: Medical

## 2019-02-21 ENCOUNTER — Ambulatory Visit: Payer: BC Managed Care – PPO | Admitting: Medical

## 2019-02-21 ENCOUNTER — Encounter: Payer: Self-pay | Admitting: Medical

## 2019-02-21 VITALS — BP 114/77 | HR 78 | Temp 98.4°F | Resp 16 | Ht 69.0 in | Wt 240.0 lb

## 2019-02-21 DIAGNOSIS — K649 Unspecified hemorrhoids: Secondary | ICD-10-CM

## 2019-02-21 DIAGNOSIS — K5900 Constipation, unspecified: Secondary | ICD-10-CM | POA: Diagnosis not present

## 2019-02-21 DIAGNOSIS — Z125 Encounter for screening for malignant neoplasm of prostate: Secondary | ICD-10-CM | POA: Diagnosis not present

## 2019-02-21 DIAGNOSIS — R739 Hyperglycemia, unspecified: Secondary | ICD-10-CM | POA: Diagnosis not present

## 2019-02-21 DIAGNOSIS — E785 Hyperlipidemia, unspecified: Secondary | ICD-10-CM | POA: Diagnosis not present

## 2019-02-21 DIAGNOSIS — I10 Essential (primary) hypertension: Secondary | ICD-10-CM

## 2019-02-21 LAB — COMPREHENSIVE METABOLIC PANEL
ALT: 26 U/L (ref 0–53)
AST: 16 U/L (ref 0–37)
Albumin: 4.6 g/dL (ref 3.5–5.2)
Alkaline Phosphatase: 69 U/L (ref 39–117)
BUN: 15 mg/dL (ref 6–23)
CO2: 27 mEq/L (ref 19–32)
Calcium: 9.4 mg/dL (ref 8.4–10.5)
Chloride: 104 mEq/L (ref 96–112)
Creatinine, Ser: 1.02 mg/dL (ref 0.40–1.50)
GFR: 95.45 mL/min (ref >60.00)
Glucose, Bld: 108 mg/dL — ABNORMAL HIGH (ref 70–99)
Potassium: 3.9 mEq/L (ref 3.5–5.1)
Sodium: 139 mEq/L (ref 135–145)
Total Bilirubin: 0.7 mg/dL (ref 0.2–1.2)
Total Protein: 7.1 g/dL (ref 6.0–8.3)

## 2019-02-21 LAB — LIPID PANEL
Cholesterol: 157 mg/dL (ref 0–200)
HDL: 39 mg/dL — ABNORMAL LOW (ref >39.00)
LDL Cholesterol: 94 mg/dL (ref 0–99)
NonHDL: 117.55
Total CHOL/HDL Ratio: 4
Triglycerides: 116 mg/dL (ref 0.0–149.0)
VLDL: 23.2 mg/dL (ref 0.0–40.0)

## 2019-02-21 LAB — PSA: PSA: 0.34 ng/mL (ref 0.10–4.00)

## 2019-02-21 LAB — HEMOGLOBIN A1C: Hgb A1c MFr Bld: 6 % (ref 4.6–6.5)

## 2019-02-21 MED ORDER — HYDROCORTISONE ACETATE 25 MG RE SUPP: 25.0000 mg | Freq: Two times a day (BID) | RECTAL | 0 refills | Status: AC

## 2019-02-21 MED ORDER — BUSPIRONE HCL 7.5 MG PO TABS: 7.5000 mg | ORAL_TABLET | Freq: Two times a day (BID) | ORAL | 0 refills | Status: AC

## 2019-02-21 NOTE — Patient Instructions (Addendum)
You do report some recent constipation and this can contribute to hemorrhoids.  On exam there does appear to be small nonthrombosed hemorrhoid present.  Recommend that you hydrate well and you could do a sitz bath in addition to prescription of Anusol HC suppositories.  Since he responded well to preparation H last night think this area should flare down.  For constipation I would recommend that you occasionally use Dulcolax every second or third day if no significant bowel movement.  If rectal area discomfort persists or constipation becomes more severe then can refer to specialist.  Today will get x-ray of abdomen to assess amount of stool that may be present.  Your blood pressure is well controlled today, continue current BP med regimen.  For history of high cholesterol, continue current statin.  Will get CMP and lipid panel today.  For history of elevated sugar, will get A1c today.  You also mention some recent anxiety about COVID and over health matters.  I could make medication called BuSpar available that you gets start if anxiety not improving.  Follow-up in 1 month or as needed.

## 2019-02-21 NOTE — Progress Notes (Signed)
Subjective:    Patient ID: Rodney Bruce, male    DOB: 09/19/1975, 43 y.o.   MRN: [id number]  HPI  Pt in for evaluation of some recent rectal area pain. He states since last weekend he states started to feel constipation and hemorrhoid flare. He states feels like urge to defecate. But when he does have bm recently smaller and harder stool. Pt states last night he used preparation h and seemed to help with rectal area pain.  Pt had some constipation in past on last virtual visit. Pt states he was using probiotics after last visit and his bowel movements were better. Also was taking fiber supplement. Pt feels like he maybe not hydrating as much as he used to. No blood seen in stool.  Pt states having some anxiety about life. He states covid situation as well of any medical issues will make him anxious   Pt notes that when he smoked he had bowel movement every day after smoking in am.  Last normal bowel movement Monday.    Review of Systems  Constitutional: Negative for chills, fatigue and fever.  HENT: Negative for dental problem.   Respiratory: Negative for cough, chest tightness, shortness of breath and wheezing.   Cardiovascular: Negative for chest pain and palpitations.  Gastrointestinal: Positive for constipation. Negative for abdominal pain, diarrhea, nausea and vomiting.  Musculoskeletal: Negative for back pain and neck pain.  Skin: Negative for rash.  Neurological: Negative for dizziness, weakness, numbness and headaches.  Hematological: Negative for adenopathy. Does not bruise/bleed easily.  Psychiatric/Behavioral: Negative for behavioral problems, decreased concentration and dysphoric mood.    Past Medical History:  Diagnosis Date  . Anxiety   . Bleeding ulcer   . Hyperlipidemia   . Hypertension   . OSA (obstructive sleep apnea) 07/05/2018     Social History   Socioeconomic History  . Marital status: Single    Spouse name: Not on file  . Number of  children: Not on file  . Years of education: Not on file  . Highest education level: Not on file  Occupational History  . Not on file  Social Needs  . Financial resource strain: Not on file  . Food insecurity    Worry: Not on file    Inability: Not on file  . Transportation needs    Medical: Not on file    Non-medical: Not on file  Tobacco Use  . Smoking status: Former Smoker    Packs/day: 1.00    Years: 20.00    Pack years: 20.00    Types: Cigarettes    Quit date: 11/23/2017    Years since quitting: 1.2  . Smokeless tobacco: Never Used  Substance and Sexual Activity  . Alcohol use: No  . Drug use: No  . Sexual activity: Yes  Lifestyle  . Physical activity    Days per week: Not on file    Minutes per session: Not on file  . Stress: Not on file  Relationships  . Social Musicianconnections    Talks on phone: Not on file    Gets together: Not on file    Attends religious service: Not on file    Active member of club or organization: Not on file    Attends meetings of clubs or organizations: Not on file    Relationship status: Not on file  . Intimate partner violence    Fear of current or ex partner: Not on file    Emotionally abused: Not on file  Physically abused: Not on file    Forced sexual activity: Not on file  Other Topics Concern  . Not on file  Social History Narrative  . Not on file    Past Surgical History:  Procedure Laterality Date  . TONSILLECTOMY  age 43    Family History  Problem Relation Age of Onset  . Hypertension Mother   . Hyperlipidemia Mother     No Known Allergies  Current Outpatient Medications on File Prior to Visit  Medication Sig Dispense Refill  . amLODipine (NORVASC) 10 MG tablet TAKE 1 TABLET(10 MG) BY MOUTH DAILY 90 tablet 0  . atorvastatin (LIPITOR) 20 MG tablet TAKE 1 TABLET(20 MG) BY MOUTH DAILY 90 tablet 1  . azelastine (ASTELIN) 0.1 % nasal spray Place 2 sprays into both nostrils 2 (two) times daily as needed for rhinitis. 30  mL 5  . cloNIDine (CATAPRES) 0.1 MG tablet TAKE 1 TABLET(0.1 MG) BY MOUTH TWICE DAILY 60 tablet 5  . hydrochlorothiazide (HYDRODIURIL) 25 MG tablet TAKE 1 TABLET(25 MG) BY MOUTH DAILY 90 tablet 0  . losartan (COZAAR) 100 MG tablet TAKE 1 TABLET(100 MG) BY MOUTH DAILY 90 tablet 0  . metoprolol succinate (TOPROL-XL) 100 MG 24 hr tablet TAKE 1 TABLET BY MOUTH DAILY IMMEDIATELY FOLLOWING A MEAL 30 tablet 3  . mirtazapine (REMERON) 30 MG tablet TAKE 1 TABLET BY MOUTH AT BEDTIME 30 tablet 1  . triamcinolone (NASACORT) 55 MCG/ACT AERO nasal inhaler Place 2 sprays into the nose daily. 1 Bottle 5   No current facility-administered medications on file prior to visit.     BP 114/77 Comment: this morning. Yesterday bp was 138/83. also 117/77  Pulse 78   Temp 98.4 F (36.9 C) (Oral)   Resp 16   Ht 5\' 9"  (1.753 m)   Wt 240 lb (108.9 kg)   SpO2 100%   BMI 35.44 kg/m       Objective:   Physical Exam  General Mental Status- Alert. General Appearance- Not in acute distress.   Skin General: Color- Normal Color. Moisture- Normal Moisture.  Neck Carotid Arteries- Normal color. Moisture- Normal Moisture. No carotid bruits. No JVD.  Chest and Lung Exam Auscultation: Breath Sounds:-Normal.  Cardiovascular Auscultation:Rythm- Regular. Murmurs & Other Heart Sounds:Auscultation of the heart reveals- No Murmurs.  Abdomen Inspection:-Inspeection Normal. Palpation/Percussion:Note:No mass. Palpation and Percussion of the abdomen reveal- Non Tender, Non Distended + BS, no rebound or guarding.  Neurologic Cranial Nerve exam:- CN III-XII intact(No nystagmus), symmetric smile. Strength:- 5/5 equal and symmetric strength both upper and lower extremities.       Assessment & Plan:  You do report some recent constipation and this can contribute to hemorrhoids.  On exam there does appear to be small nonthrombosed hemorrhoid present.  Recommend that you hydrate well and you could do a sitz bath in  addition to prescription of Anusol HC suppositories.  Since he responded well to preparation H last night think this area should flare down.  For constipation I would recommend that you occasionally use Dulcolax every second or third day if no significant bowel movement.  If rectal area discomfort persists or constipation becomes more severe then can refer to specialist.  Today will get x-ray of abdomen to assess amount of stool that may be present.  Your blood pressure is well controlled today, continue current BP med regimen.  For history of high cholesterol, continue current statin.  Will get CMP and lipid panel today.  For history of elevated sugar, will  get A1c today.  You also mention some recent anxiety about COVID and over health matters.  I could make medication called BuSpar available that you gets start if anxiety not improving.  25 minutes spent with patient today.  50% time spent counseling on diagnosis and treatment plan.  Follow-up in 1 month or as needed.  Mackie Pai, PA-C

## 2019-03-22 ENCOUNTER — Other Ambulatory Visit: Payer: Self-pay | Admitting: Medical

## 2019-04-09 ENCOUNTER — Ambulatory Visit: Payer: BC Managed Care – PPO | Admitting: Medical

## 2019-04-20 ENCOUNTER — Other Ambulatory Visit: Payer: Self-pay | Admitting: Medical

## 2019-07-24 ENCOUNTER — Other Ambulatory Visit: Payer: Self-pay

## 2019-07-24 MED ORDER — ATORVASTATIN CALCIUM 20 MG PO TABS: ORAL_TABLET | ORAL | 1 refills | Status: DC

## 2019-07-24 MED ORDER — AMLODIPINE BESYLATE 10 MG PO TABS: ORAL_TABLET | ORAL | 0 refills | Status: DC

## 2019-07-24 MED ORDER — LOSARTAN POTASSIUM 100 MG PO TABS: ORAL_TABLET | ORAL | 0 refills | Status: DC

## 2019-10-17 ENCOUNTER — Other Ambulatory Visit: Payer: Self-pay | Admitting: Medical

## 2019-10-23 IMAGING — DX ABDOMEN - 1 VIEW
2 series · 2 of 2 positions shown · non-contrast
Comparison: None

CLINICAL DATA: Ongoing constipation issues for 1 month, had
surgical repair of a bleeding ulcer 7 years ago, hypertension,
former smoker

EXAM:
ABDOMEN - 1 VIEW

[abdomen kub (1 of 2)]
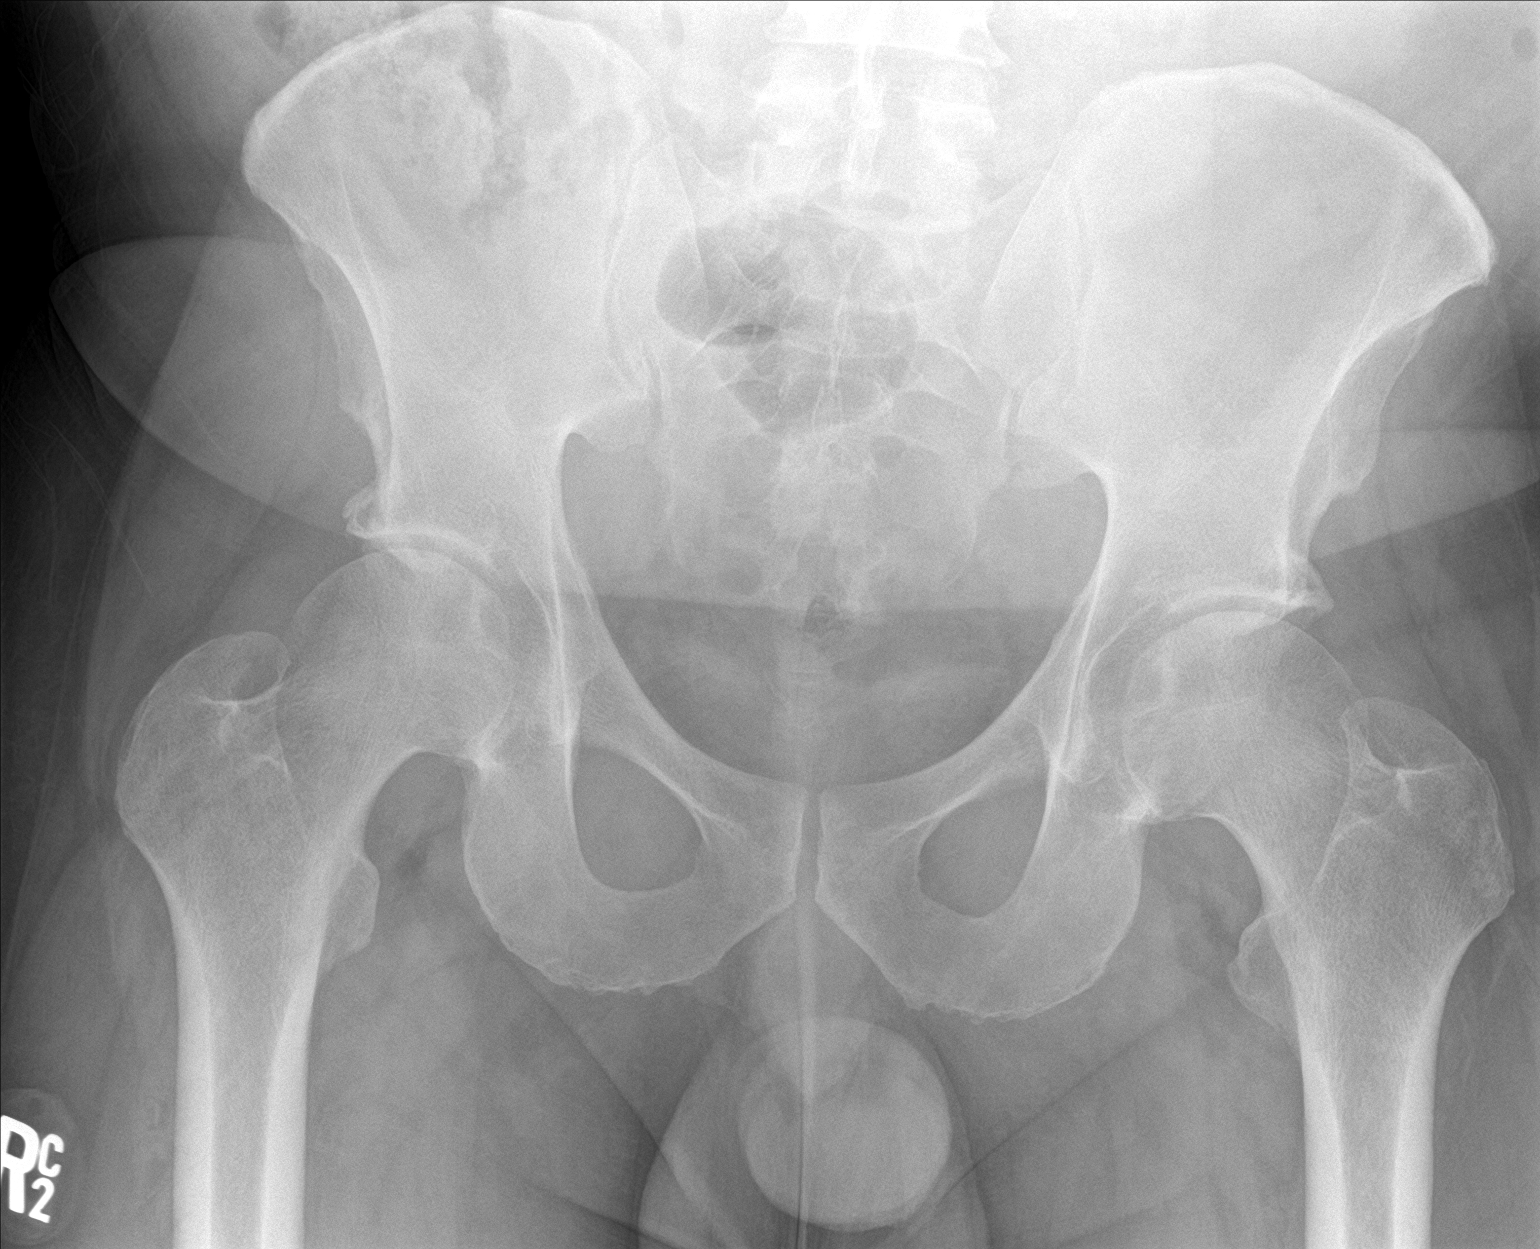

[abdomen kub (2 of 2)]
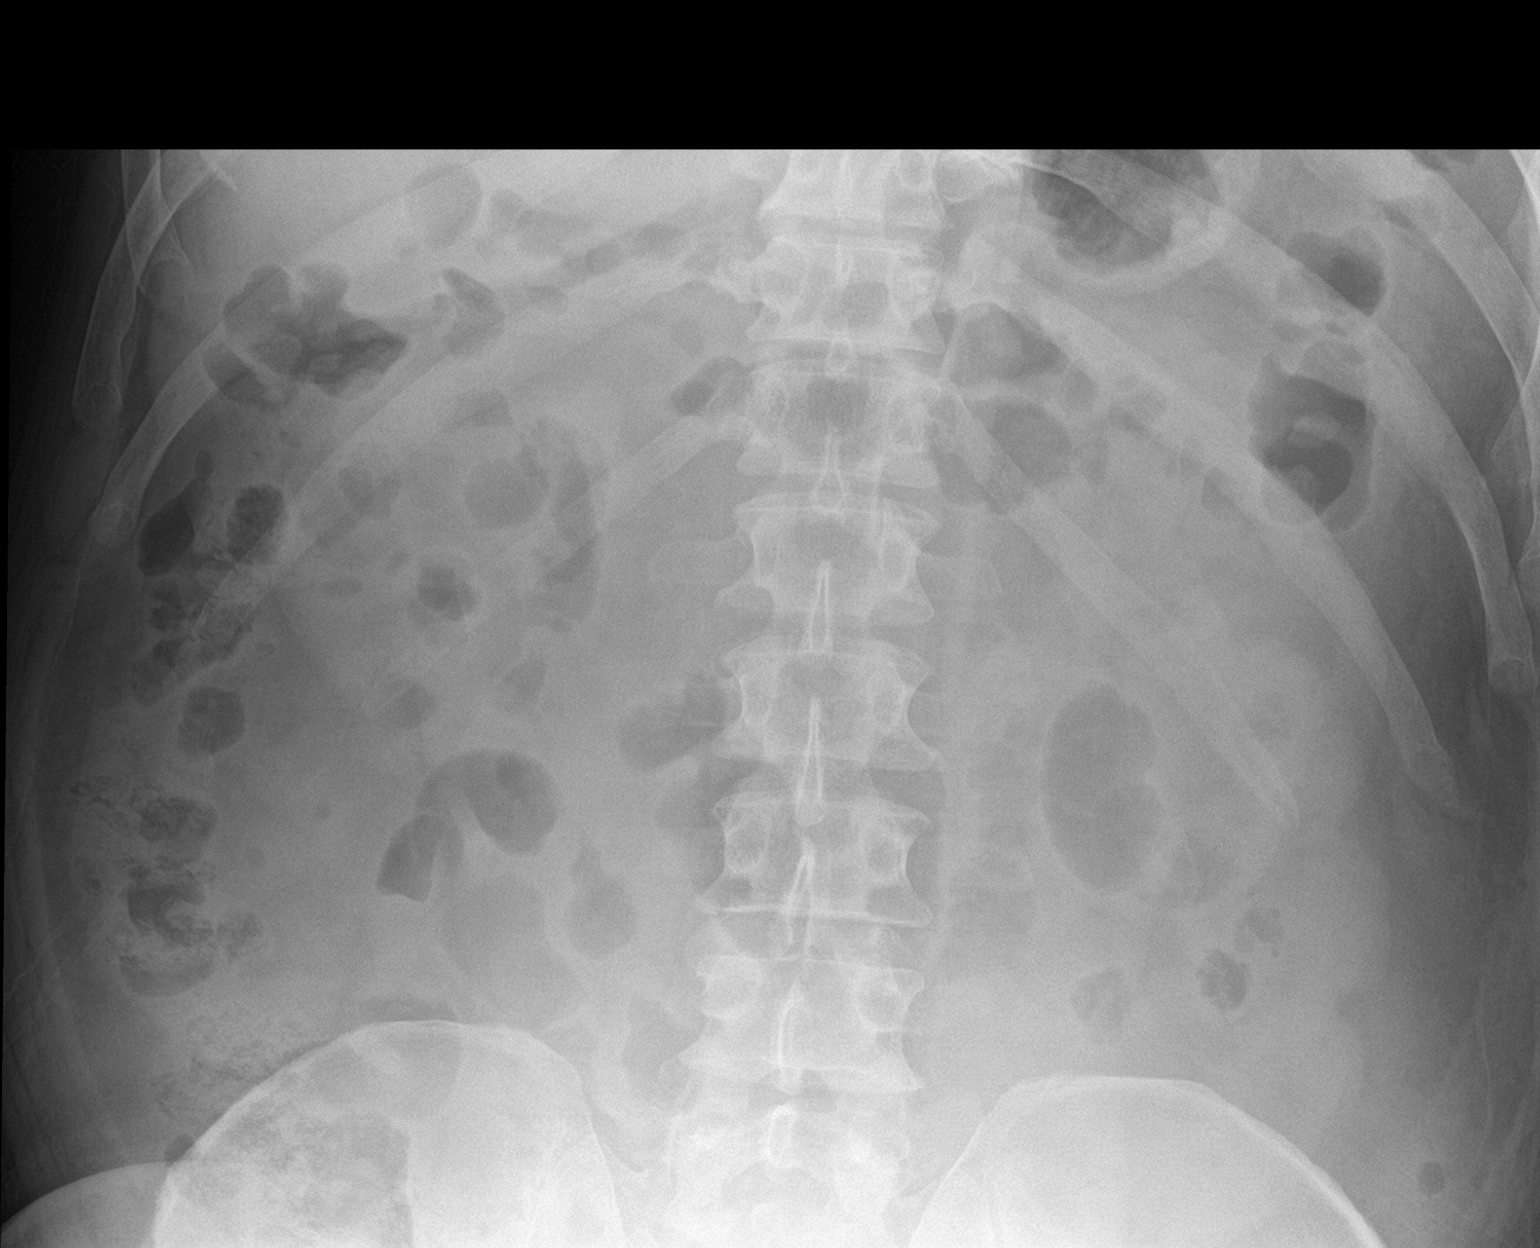

[2 of 2 positions shown; findings below may reference images not displayed]

FINDINGS: Normal bowel gas pattern.

Normal stool burden.

No bowel dilatation or bowel wall thickening.

Osseous structures unremarkable.

No urinary tract calcification.
IMPRESSION: Normal exam.

## 2019-11-05 ENCOUNTER — Ambulatory Visit: Payer: Self-pay | Attending: Internal Medicine

## 2019-11-05 DIAGNOSIS — Z23 Encounter for immunization: Secondary | ICD-10-CM

## 2019-11-05 NOTE — Progress Notes (Signed)
   Covid-19 Vaccination Clinic  Name:  Tamika Nou    MRN: [id number] DOB: 1975/08/28  11/05/2019  Mr. Tienda was observed post Covid-19 immunization for 15 minutes without incident. He was provided with Vaccine Information Sheet and instruction to access the V-Safe system.   Mr. Auzenne was instructed to call 911 with any severe reactions post vaccine: Marland Kitchen Difficulty breathing  . Swelling of face and throat  . A fast heartbeat  . A bad rash all over body  . Dizziness and weakness   Immunizations Administered    Name Date Dose VIS Date Route   Pfizer COVID-19 Vaccine 11/05/2019  3:01 PM 0.3 mL 08/03/2019 Intramuscular   Manufacturer: [employer], [employer]   Lot: [id number]   NDC: [address]-7

## 2019-11-27 ENCOUNTER — Ambulatory Visit: Payer: Self-pay

## 2019-12-05 ENCOUNTER — Ambulatory Visit: Payer: Self-pay

## 2019-12-06 ENCOUNTER — Ambulatory Visit: Payer: Self-pay | Attending: Internal Medicine

## 2019-12-06 DIAGNOSIS — Z23 Encounter for immunization: Secondary | ICD-10-CM

## 2019-12-06 NOTE — Progress Notes (Signed)
   Covid-19 Vaccination Clinic  Name:  Jamire Shabazz    MRN: [id number] DOB: 07/25/1976  12/06/2019  Mr. Ostergaard was observed post Covid-19 immunization for 15 minutes without incident. He was provided with Vaccine Information Sheet and instruction to access the V-Safe system.   Mr. Gartrell was instructed to call 911 with any severe reactions post vaccine: Marland Kitchen Difficulty breathing  . Swelling of face and throat  . A fast heartbeat  . A bad rash all over body  . Dizziness and weakness   Immunizations Administered    Name Date Dose VIS Date Route   Pfizer COVID-19 Vaccine 12/06/2019  9:44 AM 0.3 mL 08/03/2019 Intramuscular   Manufacturer: [employer], [employer]   Lot: [id number]   NDC: [address]-7

## 2020-01-15 ENCOUNTER — Other Ambulatory Visit: Payer: Self-pay | Admitting: Medical

## 2020-04-14 ENCOUNTER — Other Ambulatory Visit: Payer: Self-pay | Admitting: Medical

## 2020-07-13 ENCOUNTER — Other Ambulatory Visit: Payer: Self-pay | Admitting: Medical

## 2020-10-11 ENCOUNTER — Other Ambulatory Visit: Payer: Self-pay | Admitting: Medical

## 2021-01-09 ENCOUNTER — Other Ambulatory Visit: Payer: Self-pay | Admitting: Medical

## 2021-04-09 ENCOUNTER — Other Ambulatory Visit: Payer: Self-pay | Admitting: Medical

## 2021-05-09 ENCOUNTER — Other Ambulatory Visit: Payer: Self-pay | Admitting: Medical
